# Patient Record
Sex: Female | Born: 1970 | Race: White | Hispanic: No | Marital: Married | Smoking: Never smoker
Health system: Southern US, Community
[De-identification: ages and names within clinical notes are randomized; demographics above are authoritative.]

## PROBLEM LIST (undated history)

## (undated) DIAGNOSIS — N390 Urinary tract infection, site not specified: Secondary | ICD-10-CM

## (undated) DIAGNOSIS — Z8619 Personal history of other infectious and parasitic diseases: Secondary | ICD-10-CM

## (undated) DIAGNOSIS — Z9109 Other allergy status, other than to drugs and biological substances: Secondary | ICD-10-CM

## (undated) HISTORY — DX: Other allergy status, other than to drugs and biological substances: Z91.09

## (undated) HISTORY — DX: Urinary tract infection, site not specified: N39.0

## (undated) HISTORY — PX: KNEE SURGERY: SHX244

## (undated) HISTORY — DX: Personal history of other infectious and parasitic diseases: Z86.19

---
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
## 2013-10-06 ENCOUNTER — Ambulatory Visit: Payer: Self-pay | Admitting: Gynecology

## 2013-10-20 ENCOUNTER — Other Ambulatory Visit (HOSPITAL_COMMUNITY)
Admission: RE | Admit: 2013-10-20 | Discharge: 2013-10-20 | Disposition: A | Discharge status: Home / Self Care | Payer: 59 | Source: Ambulatory Visit | Attending: Gynecology | Admitting: Gynecology

## 2013-10-20 ENCOUNTER — Ambulatory Visit (INDEPENDENT_AMBULATORY_CARE_PROVIDER_SITE_OTHER): Payer: 59 | Admitting: Gynecology

## 2013-10-20 ENCOUNTER — Encounter: Payer: Self-pay | Admitting: Gynecology

## 2013-10-20 VITALS — BP 98/68 | Ht 68.28 in | Wt 143.0 lb

## 2013-10-20 DIAGNOSIS — Z01419 Encounter for gynecological examination (general) (routine) without abnormal findings: Secondary | ICD-10-CM

## 2013-10-20 DIAGNOSIS — Z1151 Encounter for screening for human papillomavirus (HPV): Secondary | ICD-10-CM | POA: Insufficient documentation

## 2013-10-20 DIAGNOSIS — N926 Irregular menstruation, unspecified: Secondary | ICD-10-CM

## 2013-10-20 DIAGNOSIS — N841 Polyp of cervix uteri: Secondary | ICD-10-CM

## 2013-10-20 NOTE — Progress Notes (Signed)
Mirely Pangle 09/09/1970 [id number]   History:    43 y.o.  for annual gyn exam who is a new patient to the practice. Patient has moved to this country from [place]. Her last gynecological exam was approximately 3 years ago. Patient denied any prior history of abnormal Pap smears. Patient is sexually active and her partner is using condoms. Patient was having normal menstrual cycles until March whereby she had 2 menstrual cycles as follows: She bled for 3 days stopped for one day and bled for half a day. 10 days later she bled again for 5 more days. Patient stated she had a normal mammogram in 2012.  Past medical history,surgical history, family history and social history were all reviewed and documented in the EPIC chart.  Gynecologic History Patient's last menstrual period was 10/05/2013. Contraception: condoms Last Pap: 2012. Results were: normal Last mammogram: 2012. Results were: normal  Obstetric History OB History  Gravida Para Term Preterm AB SAB TAB Ectopic Multiple Living  2 2        2     # Outcome Date GA Lbr Len/2nd Weight Sex Delivery Anes PTL Lv  2 PAR           1 PAR                ROS: A ROS was performed and pertinent positives and negatives are included in the history.  GENERAL: No fevers or chills. HEENT: No change in vision, no earache, sore throat or sinus congestion. NECK: No pain or stiffness. CARDIOVASCULAR: No chest pain or pressure. No palpitations. PULMONARY: No shortness of breath, cough or wheeze. GASTROINTESTINAL: No abdominal pain, nausea, vomiting or diarrhea, melena or bright red blood per rectum. GENITOURINARY: No urinary frequency, urgency, hesitancy or dysuria. MUSCULOSKELETAL: No joint or muscle pain, no back pain, no recent trauma. DERMATOLOGIC: No rash, no itching, no lesions. ENDOCRINE: No polyuria, polydipsia, no heat or cold intolerance. No recent change in weight. HEMATOLOGICAL: No anemia or easy bruising or bleeding. NEUROLOGIC: No headache,  seizures, numbness, tingling or weakness. PSYCHIATRIC: No depression, no loss of interest in normal activity or change in sleep pattern.     Exam: chaperone present  BP 98/68  Ht 5' 8.28" (1.734 m)  Wt 143 lb (64.864 kg)  BMI 21.57 kg/m2  LMP 10/05/2013  Body mass index is 21.57 kg/(m^2).  General appearance : Well developed well nourished female. No acute distress HEENT: Neck supple, trachea midline, no carotid bruits, no thyroidmegaly Lungs: Clear to auscultation, no rhonchi or wheezes, or rib retractions  Heart: Regular rate and rhythm, no murmurs or gallops Breast:Examined in sitting and supine position were symmetrical in appearance, no palpable masses or tenderness,  no skin retraction, no nipple inversion, no nipple discharge, no skin discoloration, no axillary or supraclavicular lymphadenopathy Abdomen: no palpable masses or tenderness, no rebound or guarding Extremities: no edema or skin discoloration or tenderness  Pelvic:  Bartholin, Urethra, Skene Glands: Within normal limits             Vagina: No gross lesions or discharge  Cervix: Cervical polyp  Uterus  anteverted, normal size, shape and consistency, non-tender and mobile  Adnexa  Without masses or tenderness  Anus and perineum  normal   Rectovaginal  normal sphincter tone without palpated masses or tenderness             Hemoccult not indicated     Assessment/Plan:  43 y.o. female for annual exam with irregular cycles and a  cervical polyp was noted today. Her Pap smear was done today. She will return back to the office for sonohysterogram to better assess her intrauterine cavity and proceed with an endometrial biopsy and removal of her cervical polyp. When she returns in a fasting state the following labs will be ordered, fasting lipid profile, comprehensive metabolic panel, CBC, TSH and urinalysis. Patient was provided with a requisition schedule her mammogram. We discussed importance of calcium and vitamin D and  regular exercise for osteoporosis prevention.  Note: This dictation was prepared with  Dragon/digital dictation along withSmart phrase technology. Any transcriptional errors that result from this process are unintentional.   Terrance Mass MD, 9:39 AM 10/20/2013

## 2013-10-20 NOTE — Addendum Note (Signed)
Addended by: Su Grand A on: 10/20/2013 11:04 AM   Modules accepted: Orders

## 2013-10-20 NOTE — Patient Instructions (Addendum)
Endometrial Biopsy Endometrial biopsy is a procedure in which a tissue sample is taken from inside the uterus. The tissue sample is then looked at under a microscope to see if the tissue is normal or abnormal. The endometrium is the lining of the uterus. This procedure helps determine where you are in your menstrual cycle and how hormone levels are affecting the lining of the uterus. This procedure may also be used to evaluate uterine bleeding or to diagnose endometrial cancer, tuberculosis, polyps, or inflammatory conditions.  LET [facility] CARE PROVIDER KNOW ABOUT:  Any allergies you have.  All medicines you are taking, including vitamins, herbs, eye drops, creams, and over-the-counter medicines.  Previous problems you or members of your family have had with the use of anesthetics.  Any blood disorders you have.  Previous surgeries you have had.  Medical conditions you have.  Possibility of pregnancy. RISKS AND COMPLICATIONS Generally, this is a safe procedure. However, as with any procedure, complications can occur. Possible complications include:  Bleeding.  Pelvic infection.  Puncture of the uterine wall with the biopsy device (rare). BEFORE THE PROCEDURE   Keep a record of your menstrual cycles as directed by your health care provider. You may need to schedule your procedure for a specific time in your cycle.  You may want to bring a sanitary pad to wear home after the procedure.  Arrange for someone to drive you home after the procedure if you will be given a medicine to help you relax (sedative). PROCEDURE   You may be given a sedative to relax you.  You will lie on an exam table with your feet and legs supported as in a pelvic exam.  Your health care provider will insert an instrument (speculum) into your vagina to see your cervix.  Your cervix will be cleansed with an antiseptic solution. A medicine (local anesthetic) will be used to numb the cervix.  A forceps  instrument (tenaculum) will be used to hold your cervix steady for the biopsy.  A thin, rodlike instrument (uterine sound) will be inserted through your cervix to determine the length of your uterus and the location where the biopsy sample will be removed.  A thin, flexible tube (catheter) will be inserted through your cervix and into the uterus. The catheter is used to collect the biopsy sample from your endometrial tissue.  The catheter and speculum will then be removed, and the tissue sample will be sent to a lab for examination. AFTER THE PROCEDURE  You will rest in a recovery area until you are ready to go home.  You may have mild cramping and a small amount of vaginal bleeding for a few days after the procedure. This is normal.  Make sure you find out how to get your test results. Document Released: 10/09/2004 Document Revised: 02/08/2013 Document Reviewed: 11/23/2012 [facility] Patient Information 2014 [place], [place]. Breast Self-Awareness Practicing breast self-awareness may pick up problems early, prevent significant medical complications, and possibly save your life. By practicing breast self-awareness, you can become familiar with how your breasts look and feel and if your breasts are changing. This allows you to notice changes early. It can also offer you some reassurance that your breast health is good. One way to learn what is normal for your breasts and whether your breasts are changing is to do a breast self-exam. If you find a lump or something that was not present in the past, it is best to contact your caregiver right away. Other findings  that should be evaluated by your caregiver include nipple discharge, especially if it is bloody; skin changes or reddening; areas where the skin seems to be pulled in (retracted); or new lumps and bumps. Breast pain is seldom associated with cancer (malignancy), but should also be evaluated by a caregiver. HOW TO PERFORM A BREAST SELF-EXAM The  best time to examine your breasts is 5 7 days after your menstrual period is over. During menstruation, the breasts are lumpier, and it may be more difficult to pick up changes. If you do not menstruate, have reached menopause, or had your uterus removed (hysterectomy), you should examine your breasts at regular intervals, such as monthly. If you are breastfeeding, examine your breasts after a feeding or after using a breast pump. Breast implants do not decrease the risk for lumps or tumors, so continue to perform breast self-exams as recommended. Talk to your caregiver about how to determine the difference between the implant and breast tissue. Also, talk about the amount of pressure you should use during the exam. Over time, you will become more familiar with the variations of your breasts and more comfortable with the exam. A breast self-exam requires you to remove all your clothes above the waist. 1. Look at your breasts and nipples. Stand in front of a mirror in a room with good lighting. With your hands on your hips, push your hands firmly downward. Look for a difference in shape, contour, and size from one breast to the other (asymmetry). Asymmetry includes puckers, dips, or bumps. Also, look for skin changes, such as reddened or scaly areas on the breasts. Look for nipple changes, such as discharge, dimpling, repositioning, or redness. 2. Carefully feel your breasts. This is best done either in the shower or tub while using soapy water or when flat on your back. Place the arm (on the side of the breast you are examining) above your head. Use the pads (not the fingertips) of your three middle fingers on your opposite hand to feel your breasts. Start in the underarm area and use  inch (2 cm) overlapping circles to feel your breast. Use 3 different levels of pressure (light, medium, and firm pressure) at each circle before moving to the next circle. The light pressure is needed to feel the tissue closest to  the skin. The medium pressure will help to feel breast tissue a little deeper, while the firm pressure is needed to feel the tissue close to the ribs. Continue the overlapping circles, moving downward over the breast until you feel your ribs below your breast. Then, move one finger-width towards the center of the body. Continue to use the  inch (2 cm) overlapping circles to feel your breast as you move slowly up toward the collar bone (clavicle) near the base of the neck. Continue the up and down exam using all 3 pressures until you reach the middle of the chest. Do this with each breast, carefully feeling for lumps or changes. 3.  Keep a written record with breast changes or normal findings for each breast. By writing this information down, you do not need to depend only on memory for size, tenderness, or location. Write down where you are in your menstrual cycle, if you are still menstruating. Breast tissue can have some lumps or thick tissue. However, see your caregiver if you find anything that concerns you.  SEEK MEDICAL CARE IF:  You see a change in shape, contour, or size of your breasts or nipples.  You see skin changes, such as reddened or scaly areas on the breasts or nipples.   You have an unusual discharge from your nipples.   You feel a new lump or unusually thick areas.  Document Released: 06/08/2005 Document Revised: 05/25/2012 Document Reviewed: 09/23/2011 [facility] Patient Information 2014 [place]. Transvaginal Ultrasound Transvaginal ultrasound is a pelvic ultrasound, using a metal probe that is placed in the vagina, to look at a women's female organs. Transvaginal ultrasound is a method of seeing inside the pelvis of a woman. The ultrasound machine sends out sound waves from the transducer (probe). These sound waves bounce off body structures (like an echo) to create a picture. The picture shows up on a monitor. It is called transvaginal because the probe is inserted  into the vagina. There should be very little discomfort from the vaginal probe. This test can also be used during pregnancy. Endovaginal ultrasound is another name for a transvaginal ultrasound. In a transabdominal ultrasound, the probe is placed on the outside of the belly. This method gives pictures that are lower quality than pictures from the transvaginal technique. Transvaginal ultrasound is used to look for problems of the female genital tract. Some such problems include:  Infertility problems.  Congenital (birth defect) malformations of the uterus and ovaries.  Tumors in the uterus.  Abnormal bleeding.  Ovarian tumors and cysts.  Abscess (inflamed tissue around pus) in the pelvis.  Unexplained abdominal or pelvic pain.  Pelvic infection. DURING PREGNANCY, TRANSVAGINAL ULTRASOUND MAY BE USED TO LOOK AT:  Normal pregnancy.  Ectopic pregnancy (pregnancy outside the uterus).  Fetal heartbeat.  Abnormalities in the pelvis, that are not seen well with transabdominal ultrasound.  Suspected twins or multiples.  Impending miscarriage.  Problems with the cervix (incompetent cervix, not able to stay closed and hold the baby).  When doing an amniocentesis (removing fluid from the pregnancy sac, for testing).  Looking for abnormalities of the baby.  Checking the growth, development, and age of the fetus.  Measuring the amount of fluid in the amniotic sac.  When doing an external version of the baby (moving baby into correct position).  Evaluating the baby for problems in high risk pregnancies (biophysical profile).  Suspected fetal demise (death). Sometimes a special ultrasound method called Saline Infusion Sonography (SIS) is used for a more accurate look at the uterus. Sterile saline (salt water) is injected into the uterus of non-pregnant patients to see the inside of the uterus better. SIS is not used on pregnant women. The vaginal probe can also assist in obtaining  biopsies of abnormal areas, in draining fluid from cysts on the ovary, and in finding IUDs (intrauterine device, birth control) that cannot be located. PREPARATION FOR TEST A transvaginal ultrasound is done with the bladder empty. The transabdominal ultrasound is done with your bladder full. You may be asked to drink several glasses of water before that exam. Sometimes, a transabdominal ultrasound is done just after a transvaginal ultrasound, to look at organs in your abdomen. PROCEDURE  You will lie down on a table, with your knees bent and your feet in foot holders. The probe is covered with a condom. A sterile lubricant is put into the vagina and on the probe. The lubricant helps transmit the sound waves and avoid irritating the vagina. Your caregiver will move the probe inside the vaginal cavity to scan the pelvic structures. A normal test will show a normal pelvis and normal contents. An abnormal test will show abnormalities of the pelvis, placenta,  or baby. ABNORMAL RESULTS MAY BE DUE TO:  Growths or tumors in the:  Uterus.  Ovaries.  Vagina.  Other pelvic structures.  Non-cancerous growths of the uterus and ovaries.  Twisting of the ovary, cutting off blood supply to the ovary (ovarian torsion).  Areas of infection, including:  Pelvic inflammatory disease.  Abscess in the pelvis.  Locating an IUD. PROBLEMS FOUND IN PREGNANT WOMEN MAY INCLUDE:  Ectopic pregnancy (pregnancy outside the uterus).  Multiple pregnancies.  Early dilation (opening) of the cervix. This may indicate an incompetent cervix and early delivery.  Impending miscarriage.  Fetal death.  Problems with the placenta, including:  Placenta has grown over the opening of the womb (placenta previa).  Placenta has separated early in the womb (placental abruption).  Placenta grows into the muscle of the uterus (placenta accreta).  Tumors of pregnancy, including gestational trophoblastic disease. This is an  abnormal pregnancy, with no fetus. The uterus is filled with many grape-like cysts that could sometimes be cancerous.  Incorrect position of the fetus (breech, vertex).  Intrauterine fetal growth retardation (IUGR) (poor growth in the womb). Fetal abnormalities or infection.Tetanus, Diphtheria, Pertussis (Tdap) Vaccine What You Need to Know WHY GET VACCINATED? Tetanus, diphtheria and pertussis can be very serious diseases, even for adolescents and adults. Tdap vaccine can protect [place] from these diseases. TETANUS (Lockjaw) causes painful muscle tightening and stiffness, usually all over the body. It can lead to tightening of muscles in the head and neck so you can't open your mouth, swallow, or sometimes even breathe. Tetanus kills about 1 out of 5 people who are infected. DIPHTHERIA can cause a thick coating to form in the back of the throat. It can lead to breathing problems, paralysis, heart failure, and death. PERTUSSIS (Whooping Cough) causes severe coughing spells, which can cause difficulty breathing, vomiting and disturbed sleep. It can also lead to weight loss, incontinence, and rib fractures. Up to 2 in 100 adolescents and 5 in 100 adults with pertussis are hospitalized or have complications, which could include pneumonia and death. These diseases are caused by bacteria. Diphtheria and pertussis are spread from person to person through coughing or sneezing. Tetanus enters the body through cuts, scratches, or wounds. Before vaccines, the [place] saw as many as 200,000 cases a year of diphtheria and pertussis, and hundreds of cases of tetanus. Since vaccination began, tetanus and diphtheria have dropped by about 99% and pertussis by about 80%. TDAP VACCINE Tdap vaccine can protect adolescents and adults from tetanus, diphtheria, and pertussis. One dose of Tdap is routinely given at age 43 or 56. People who did not get Tdap at that age should get it as soon as possible. Tdap is  especially important for health care professionals and anyone having close contact with a baby younger than 12 months. Pregnant women should get a dose of Tdap during every pregnancy, to protect the newborn from pertussis. Infants are most at risk for severe, life-threatening complications from pertussis. A similar vaccine, called Td, protects from tetanus and diphtheria, but not pertussis. A Td booster should be given every 10 years. Tdap may be given as one of these boosters if you have not already gotten a dose. Tdap may also be given after a severe cut or burn to prevent tetanus infection. Your doctor can give you more information. Tdap may safely be given at the same time as other vaccines. SOME PEOPLE SHOULD NOT GET THIS VACCINE If you ever had a life-threatening allergic reaction after a  dose of any tetanus, diphtheria, or pertussis containing vaccine, OR if you have a severe allergy to any part of this vaccine, you should not get Tdap. Tell your doctor if you have any severe allergies. If you had a coma, or long or multiple seizures within 7 days after a childhood dose of DTP or DTaP, you should not get Tdap, unless a cause other than the vaccine was found. You can still get Td. Talk to your doctor if you: have epilepsy or another nervous system problem, had severe pain or swelling after any vaccine containing diphtheria, tetanus or pertussis, ever had Guillain-Barr Syndrome (GBS), aren't feeling well on the day the shot is scheduled. RISKS OF A VACCINE REACTION With any medicine, including vaccines, there is a chance of side effects. These are usually mild and go away on their own, but serious reactions are also possible. Brief fainting spells can follow a vaccination, leading to injuries from falling. Sitting or lying down for about 15 minutes can help prevent these. Tell your doctor if you feel dizzy or light-headed, or have vision changes or ringing in the ears. Mild problems following  Tdap (Did not interfere with activities) Pain where the shot was given (about 3 in 4 adolescents or 2 in 3 adults) Redness or swelling where the shot was given (about 1 person in 5) Mild fever of at least 100.47F (up to about 1 in 25 adolescents or 1 in 100 adults) Headache (about 3 or 4 people in 10) Tiredness (about 1 person in 3 or 4) Nausea, vomiting, diarrhea, stomach ache (up to 1 in 4 adolescents or 1 in 10 adults) Chills, body aches, sore joints, rash, swollen glands (uncommon) Moderate problems following Tdap (Interfered with activities, but did not require medical attention) Pain where the shot was given (about 1 in 5 adolescents or 1 in 100 adults) Redness or swelling where the shot was given (up to about 1 in 16 adolescents or 1 in 25 adults) Fever over 102F (about 1 in 100 adolescents or 1 in 250 adults) Headache (about 3 in 20 adolescents or 1 in 10 adults) Nausea, vomiting, diarrhea, stomach ache (up to 1 or 3 people in 100) Swelling of the entire arm where the shot was given (up to about 3 in 100). Severe problems following Tdap (Unable to perform usual activities, required medical attention) Swelling, severe pain, bleeding and redness in the arm where the shot was given (rare). A severe allergic reaction could occur after any vaccine (estimated less than 1 in a million doses). WHAT IF THERE IS A SERIOUS REACTION? What should I look for? Look for anything that concerns you, such as signs of a severe allergic reaction, very high fever, or behavior changes. Signs of a severe allergic reaction can include hives, swelling of the face and throat, difficulty breathing, a fast heartbeat, dizziness, and weakness. These would start a few minutes to a few hours after the vaccination. What should I do? If you think it is a severe allergic reaction or other emergency that can't wait, call 9-1-1 or get the person to the nearest hospital. Otherwise, call your doctor. Afterward, the  reaction should be reported to the "Vaccine Adverse Event Reporting System" (VAERS). Your doctor might file this report, or you can do it yourself through the VAERS web site at www.vaers.SamedayNews.es, or by calling (253)546-2112. VAERS is only for reporting reactions. They do not give medical advice.  THE NATIONAL VACCINE INJURY COMPENSATION PROGRAM The National Vaccine Injury Compensation Program (  VICP) is a federal program that was created to compensate people who may have been injured by certain vaccines. Persons who believe they may have been injured by a vaccine can learn about the program and about filing a claim by calling 432-075-0024 or visiting the [place] website at GoldCloset.com.ee. HOW CAN I LEARN MORE? Ask your doctor. Call your local or state health department. Contact the Centers for Disease Control and Prevention (CDC): Call (904) 488-5718 or visit CDC's website at http://hunter.com/. CDC Tdap Vaccine VIS (10/29/11) Document Released: 12/08/2011 Document Revised: 10/03/2012 Document Reviewed: 09/28/2012 [facility] Patient Information 2014 [place], [place]. RISKS AND COMPLICATIONS There are no known risks to the ultrasound procedure. There is no X-ray used when doing an ultrasound. Document Released: 05/20/2004 Document Revised: 08/31/2011 Document Reviewed: 05/08/2009 [facility] Patient Information 2014 [place], [place].

## 2013-10-23 ENCOUNTER — Other Ambulatory Visit: Payer: Self-pay

## 2013-10-23 DIAGNOSIS — Z1231 Encounter for screening mammogram for malignant neoplasm of breast: Secondary | ICD-10-CM

## 2013-10-30 ENCOUNTER — Other Ambulatory Visit: Payer: Self-pay | Admitting: Gynecology

## 2013-10-30 DIAGNOSIS — N841 Polyp of cervix uteri: Secondary | ICD-10-CM

## 2013-10-30 DIAGNOSIS — N926 Irregular menstruation, unspecified: Secondary | ICD-10-CM

## 2013-11-03 ENCOUNTER — Ambulatory Visit
Admission: RE | Admit: 2013-11-03 | Discharge: 2013-11-03 | Disposition: A | Discharge status: Home / Self Care | Payer: 59 | Source: Ambulatory Visit

## 2013-11-03 DIAGNOSIS — Z1231 Encounter for screening mammogram for malignant neoplasm of breast: Secondary | ICD-10-CM

## 2013-11-06 ENCOUNTER — Other Ambulatory Visit: Payer: Self-pay | Admitting: Gynecology

## 2013-11-06 DIAGNOSIS — R928 Other abnormal and inconclusive findings on diagnostic imaging of breast: Secondary | ICD-10-CM

## 2013-11-08 ENCOUNTER — Ambulatory Visit (INDEPENDENT_AMBULATORY_CARE_PROVIDER_SITE_OTHER): Payer: 59 | Admitting: Gynecology

## 2013-11-08 ENCOUNTER — Other Ambulatory Visit: Payer: Self-pay | Admitting: Gynecology

## 2013-11-08 ENCOUNTER — Ambulatory Visit (INDEPENDENT_AMBULATORY_CARE_PROVIDER_SITE_OTHER): Payer: 59

## 2013-11-08 ENCOUNTER — Other Ambulatory Visit: Payer: 59

## 2013-11-08 ENCOUNTER — Ambulatory Visit: Payer: 59 | Admitting: Gynecology

## 2013-11-08 DIAGNOSIS — N831 Corpus luteum cyst of ovary, unspecified side: Secondary | ICD-10-CM

## 2013-11-08 DIAGNOSIS — D3910 Neoplasm of uncertain behavior of unspecified ovary: Secondary | ICD-10-CM

## 2013-11-08 DIAGNOSIS — N83209 Unspecified ovarian cyst, unspecified side: Secondary | ICD-10-CM

## 2013-11-08 DIAGNOSIS — Z01419 Encounter for gynecological examination (general) (routine) without abnormal findings: Secondary | ICD-10-CM

## 2013-11-08 DIAGNOSIS — N854 Malposition of uterus: Secondary | ICD-10-CM

## 2013-11-08 DIAGNOSIS — O34539 Maternal care for retroversion of gravid uterus, unspecified trimester: Secondary | ICD-10-CM

## 2013-11-08 DIAGNOSIS — N9489 Other specified conditions associated with female genital organs and menstrual cycle: Secondary | ICD-10-CM

## 2013-11-08 DIAGNOSIS — N839 Noninflammatory disorder of ovary, fallopian tube and broad ligament, unspecified: Secondary | ICD-10-CM

## 2013-11-08 DIAGNOSIS — N926 Irregular menstruation, unspecified: Secondary | ICD-10-CM

## 2013-11-08 DIAGNOSIS — O34519 Maternal care for incarceration of gravid uterus, unspecified trimester: Secondary | ICD-10-CM

## 2013-11-08 DIAGNOSIS — N838 Other noninflammatory disorders of ovary, fallopian tube and broad ligament: Secondary | ICD-10-CM

## 2013-11-08 DIAGNOSIS — N841 Polyp of cervix uteri: Secondary | ICD-10-CM

## 2013-11-08 LAB — CBC WITH DIFFERENTIAL/PLATELET
Basophils Absolute: 0 10*3/uL (ref 0.0–0.1)
Basophils Relative: 0 % (ref 0–1)
Eosinophils Absolute: 0.3 10*3/uL (ref 0.0–0.7)
Eosinophils Relative: 4 % (ref 0–5)
HEMATOCRIT: 39.9 % (ref 36.0–46.0)
Hemoglobin: 13.4 g/dL (ref 12.0–15.0)
Lymphocytes Relative: 22 % (ref 12–46)
Lymphs Abs: 1.5 10*3/uL (ref 0.7–4.0)
MCH: 30.9 pg (ref 26.0–34.0)
MCHC: 33.6 g/dL (ref 30.0–36.0)
MCV: 92.1 fL (ref 78.0–100.0)
MONO ABS: 0.7 10*3/uL (ref 0.1–1.0)
MONOS PCT: 10 % (ref 3–12)
Neutro Abs: 4.4 10*3/uL (ref 1.7–7.7)
Neutrophils Relative %: 64 % (ref 43–77)
Platelets: 266 10*3/uL (ref 150–400)
RBC: 4.33 MIL/uL (ref 3.87–5.11)
RDW: 12.4 % (ref 11.5–15.5)
WBC: 6.8 10*3/uL (ref 4.0–10.5)

## 2013-11-08 LAB — COMPREHENSIVE METABOLIC PANEL
ALBUMIN: 4.4 g/dL (ref 3.5–5.2)
ALT: <8 U/L (ref 0–35)
AST: 16 U/L (ref 0–37)
Alkaline Phosphatase: 27 U/L — ABNORMAL LOW (ref 39–117)
BUN: 15 mg/dL (ref 6–23)
CALCIUM: 9.6 mg/dL (ref 8.4–10.5)
CHLORIDE: 105 meq/L (ref 96–112)
CO2: 26 mEq/L (ref 19–32)
Creat: 1.07 mg/dL (ref 0.50–1.10)
Glucose, Bld: 83 mg/dL (ref 70–99)
Potassium: 4.2 mEq/L (ref 3.5–5.3)
Sodium: 139 mEq/L (ref 135–145)
Total Bilirubin: 0.8 mg/dL (ref 0.2–1.2)
Total Protein: 7.3 g/dL (ref 6.0–8.3)

## 2013-11-08 LAB — LIPID PANEL
Cholesterol: 150 mg/dL (ref 0–200)
HDL: 66 mg/dL (ref >39)
LDL Cholesterol: 72 mg/dL (ref 0–99)
Total CHOL/HDL Ratio: 2.3 Ratio
Triglycerides: 61 mg/dL (ref <150)
VLDL: 12 mg/dL (ref 0–40)

## 2013-11-08 LAB — TSH: TSH: 4.189 u[IU]/mL (ref 0.350–4.500)

## 2013-11-08 NOTE — Patient Instructions (Signed)

## 2013-11-08 NOTE — Progress Notes (Signed)
   Patient was seen in the office as a new patient only the first who had moved [place] this [place] for suite. She had not had a gynecological exam for over 3 years. Patient denied any history of abnormal Pap smears in the past and was using condoms for contraception. She was complaining of irregular menstrual cycles as described in detail on that last office visit May 1 of this year. She is here today for a sonohysterogram and endometrial biopsy. At time of her exam she was also found to have a cervical polyp.  Patient was counseled for the above procedures.  Ultrasound: Uterus measures 7.8 x 5.0 x 4.9 cm with endometrial stripe of 12 mm. The patient was found to have on her right ovary 2 solid calcified focal masses posterior wall shadowing effect was also noted measuring 19 x 21 mm and a second one 21 x 20 mm which was avascular. A millimeter cyst internal level echoes was noted also. Left ovarian follicles with echo-free measuring 29 x 21 mm, and 27 x 22 mm. There was free fluid noted in the left ovary.  Sonohysterogram: A sterile catheter was introduced into the uterine cavity and normal saline was instilled. There was no intrauterine defects noted.  Following this a sterile Pipelle was introduced into the uterine cavity for an endometrial biopsy moderate amount of tissue was obtained and was submitted for histological evaluation.  Attention was then placed to the cervical polyp which was grasped with a ring forceps twisted off its base and submitted for histological evaluation. Silver nitrate was applied for hemostasis at the base. And the specimen submitted also for histological evaluation.  Patient's recent Pap smear was normal.  Assessment/plan: #1 cervical polyp removed pathology report pending #2 irregular bleeding postcoital bleeding probably attributed to #1 above nevertheless endometrial biopsy was performed results pending at time of this dictation #3 incidental finding 2 solid calcified  focal cystic masses on right ovary 1 measuring 19 x 21 mm second one measuring 21 x 20 mm and avascular. Also to follicles 29 x 21 mm and 27 x 22 mm. Because of patient's age and appearance of this right adnexal mass were going to have a ROMA-1 ovarian cancer screening blood test. If normal she is going to return back in 3 months for followup ultrasound. She was also fasting today several fasting lipid profile, CBC, TSH, comprehensive metabolic panel and urinalysis was obtained as well.

## 2013-11-09 LAB — URINALYSIS W MICROSCOPIC + REFLEX CULTURE
Bilirubin Urine: NEGATIVE
CASTS: NONE SEEN
CRYSTALS: NONE SEEN
Glucose, UA: NEGATIVE mg/dL
Hgb urine dipstick: NEGATIVE
Ketones, ur: NEGATIVE mg/dL
LEUKOCYTES UA: NEGATIVE
NITRITE: NEGATIVE
PH: 5.5 (ref 5.0–8.0)
Protein, ur: NEGATIVE mg/dL
SPECIFIC GRAVITY, URINE: 1.01 (ref 1.005–1.030)
Urobilinogen, UA: 0.2 mg/dL (ref 0.0–1.0)

## 2013-11-12 LAB — URINE CULTURE

## 2013-11-15 ENCOUNTER — Other Ambulatory Visit: Payer: Self-pay | Admitting: Gynecology

## 2013-11-15 MED ORDER — CIPROFLOXACIN HCL 250 MG PO TABS: 250.0000 mg | ORAL_TABLET | Freq: Two times a day (BID) | ORAL | Status: DC

## 2013-11-17 ENCOUNTER — Ambulatory Visit
Admission: RE | Admit: 2013-11-17 | Discharge: 2013-11-17 | Disposition: A | Discharge status: Home / Self Care | Payer: 59 | Source: Ambulatory Visit | Attending: Gynecology | Admitting: Gynecology

## 2013-11-17 DIAGNOSIS — R928 Other abnormal and inconclusive findings on diagnostic imaging of breast: Secondary | ICD-10-CM

## 2013-11-17 LAB — OVARIAN MALIGNANCY RISK-ROMA
CA125: 6 U/mL
HE4: 70 pmol
ROMA Postmenopausal: 0.86
ROMA Premenopausal: 1.45 — ABNORMAL HIGH

## 2013-11-20 ENCOUNTER — Telehealth: Payer: Self-pay | Admitting: Gynecology

## 2013-11-20 NOTE — Telephone Encounter (Signed)
Spoke with patient today in reference to her ROMA-1 ovarian cancer screening test which was elevated for patient's in the premenopausal age. Patient was seen in the office as a new patient on 10/20/2013 see note for detail. Patient had been complaining of dysfunctional uterine bleeding and a polyp was removed from the cervix which was benign and her ultrasound had demonstrated the right ovary with solid calcified septum masses with posterior shadowing measuring 19 x 21 mm, 21 x 20 mm, avascular. An 8 mm cyst with internal low level echoes was also noted. Left ovarian follicles pedicle free 29 x 21 mm and 27 x 22 mm. The sonohysterogram demonstrated no defects.  Her blood test result was as follows: Ref Range 1wk ago ROMA Premenopausal <1.31  1.45 (H)   ROMA Postmenopausal <2.77  0.86   CA125 <35 U/mL  6   HE4 <151 pM  70    I have recommended that she be followed by the GYN oncologist for a second opinion based on the findings of the right ovarian cystic calcifications along with the elevated ROMA-1 ovarian cancer screening tests. We will set up an appointment for her. All questions answered. Her endometrial biopsy and cervical polyp or benign as was her Pap smear.

## 2013-11-21 ENCOUNTER — Telehealth: Payer: Self-pay | Admitting: *Deleted

## 2013-11-21 NOTE — Telephone Encounter (Signed)
Message copied by Thamas Jaegers on Tue Nov 21, 2013  3:12 PM ------      Message from: Ramond Craver      Created: Mon Nov 20, 2013  2:16 PM      Regarding: Patient you have referral request for.                   ----- Message -----         From: Terrance Mass, MD         Sent: 11/20/2013   2:04 PM           To: Parachute with patient today in reference to her ROMA-1 ovarian cancer screening test which was elevated for patient's in the premenopausal age. Patient was seen in the office as a new patient on 10/20/2013 see note for detail. Patient had been complaining of dysfunctional uterine bleeding and a polyp was removed from the cervix which was benign and her ultrasound had demonstrated the right ovary with solid calcified septum masses with posterior shadowing measuring 19 x 21 mm, 21 x 20 mm, avascular. An 8 mm cyst with internal low level echoes was also noted. Left ovarian follicles pedicle free 29 x 21 mm and 27 x 22 mm. The sonohysterogram demonstrated no defects.                  Her blood test result was as follows:      Ref Range 1wk ago      ROMA Premenopausal <1.31       1.45 (H)                ROMA Postmenopausal <2.77        0.86                CA125 <35 U/mL        6                HE4 <151 pM        70                            I have recommended that she be followed by the GYN oncologist for a second opinion based on the findings of the right ovarian cystic calcifications along with the elevated ROMA-1 ovarian cancer screening tests. We will set up an appointment for her. All questions answered. Her endometrial biopsy and cervical polyp or benign as was her Pap smear.            Juliann Pulse has has been scheduled yet?       ------

## 2013-11-21 NOTE — Telephone Encounter (Signed)
Appointment on 12/18/13 @ 9:15 am with [facility] pt informed with the below note.

## 2013-12-06 ENCOUNTER — Encounter: Payer: Self-pay | Admitting: Gynecology

## 2013-12-06 ENCOUNTER — Ambulatory Visit: Payer: 59 | Attending: Gynecology | Admitting: Gynecology

## 2013-12-06 VITALS — BP 121/74 | HR 77 | Temp 98.8°F | Resp 18 | Ht 68.5 in

## 2013-12-06 DIAGNOSIS — N83209 Unspecified ovarian cyst, unspecified side: Secondary | ICD-10-CM

## 2013-12-06 DIAGNOSIS — N841 Polyp of cervix uteri: Secondary | ICD-10-CM | POA: Insufficient documentation

## 2013-12-06 DIAGNOSIS — N921 Excessive and frequent menstruation with irregular cycle: Secondary | ICD-10-CM

## 2013-12-06 NOTE — Patient Instructions (Signed)
Return to Dr. Toney Rakes for a repeat ultrasound in August as scheduled.

## 2013-12-06 NOTE — Progress Notes (Signed)
Consult Note: Gyn-Onc   Shari George 43 y.o. female  Chief Complaint  Patient presents with  . Metrorrhagia    New consult     Assessment : Small right ovarian cyst with calcification found incidentally on ultrasound. R OMA score is slightly elevated. Options were discussed with the patient and her husband. Basically we continued to observe this lesion which I believe is likely benign. Or alternatively, recommend laparoscopic right salpingo-oophorectomy. After discussing the pros and cons of each approach, the patient and her husband are comfortable with continuing observation. Therefore recommend she have repeat ultrasound approximately 3 months. She tells me she has one already scheduled with Dr. Rudi Rummage. As long as there is no significant change, I recommend a followup scan 6 months later. If on the other hand there is continuing enlargement or changing characteristics of the cysts and light see the patient back for further evaluation.  Plan: The patient will return to Dr. Ulice Brilliant office to have a repeat ultrasound in August.  HPI:  43 year old white married female seen in consultation request of Dr. Uvaldo Rising regarding a newly diagnosed ovarian mass. Patient was seen initially by Dr. Toney Rakes for annual examination and some irregular menstrual cycles. She was found to have a cervical polyp. An ultrasound was performed with findings of an incidental 1.9 x 2.1 cm and 2.1 x 2.0 cm calcified focally cystic masses in the right ovary. A ROMA test was performed that was slightly abnormal in the setting of a premenopausal patient (1.45 when normal is less than 1.31) CA 125 was 6 units per mL and a HE4 was 70 (normal less than 150).  The patient reports she is entirely asymptomatic. She says it denies any pelvic pain pressure or any GI or GU symptoms. She has no symptoms suggestive of ovarian cancer. She does not have any family history of breast or ovarian cancer.  Review of  Systems:10 point review of systems is negative except as noted in interval history.   Vitals: Blood pressure 121/74, pulse 77, temperature 98.8 F (37.1 C), temperature source Oral, resp. rate 18, height 5' 8.5" (1.74 m).  Physical Exam: General : The patient is a healthy woman in no acute distress.  HEENT: normocephalic, extraoccular movements normal; neck is supple without thyromegally  Lynphnodes: Supraclavicular and inguinal nodes not enlarged  Abdomen: Soft, non-tender, no ascites, no organomegally, no masses, no hernias  Pelvic:  EGBUS: Normal female  Vagina: Normal, no lesions  Urethra and Bladder: Normal, non-tender  Cervix: Parous Uterus: Anterior normal shape size and consistency  Bi-manual examination: Non-tender; no adenxal masses or nodularity  Rectal: normal sphincter tone, no masses, no blood  Lower extremities: No edema or varicosities. Normal range of motion      No Known Allergies  History reviewed. No pertinent past medical history.  Past Surgical History  Procedure Laterality Date  . Knee surgery Left   . Left  knee   1988    " Bone lying on muscle"     Current Outpatient Prescriptions  Medication Sig Dispense Refill  . ciprofloxacin (CIPRO) 250 MG tablet Take 1 tablet (250 mg total) by mouth 2 (two) times daily.  6 tablet  0   No current facility-administered medications for this visit.    History   Social History  . Marital Status: Married    Spouse Name: N/A    Number of Children: N/A  . Years of Education: N/A   Occupational History  . Not on file.   Social  History Main Topics  . Smoking status: Never Smoker   . Smokeless tobacco: Not on file  . Alcohol Use: 1.2 oz/week    2 Glasses of wine per week     Comment: MODERATE  . Drug Use: Not on file  . Sexual Activity: Yes    Birth Control/ Protection: None   Other Topics Concern  . Not on file   Social History Narrative  . No narrative on file    Family History  Problem  Relation Age of Onset  . Hypertension Father   . Heart Problems Father       Alvino Chapel, MD 12/06/2013, 11:33 AM

## 2013-12-18 ENCOUNTER — Ambulatory Visit: Payer: 59 | Admitting: Gynecology

## 2014-02-12 ENCOUNTER — Ambulatory Visit (INDEPENDENT_AMBULATORY_CARE_PROVIDER_SITE_OTHER): Payer: 59 | Admitting: Gynecology

## 2014-02-12 ENCOUNTER — Ambulatory Visit (INDEPENDENT_AMBULATORY_CARE_PROVIDER_SITE_OTHER): Payer: 59

## 2014-02-12 ENCOUNTER — Encounter: Payer: Self-pay | Admitting: Gynecology

## 2014-02-12 ENCOUNTER — Ambulatory Visit: Payer: 59 | Admitting: Gynecology

## 2014-02-12 ENCOUNTER — Other Ambulatory Visit: Payer: 59

## 2014-02-12 ENCOUNTER — Other Ambulatory Visit: Payer: Self-pay | Admitting: Gynecology

## 2014-02-12 VITALS — BP 100/68

## 2014-02-12 DIAGNOSIS — N831 Corpus luteum cyst of ovary, unspecified side: Secondary | ICD-10-CM

## 2014-02-12 DIAGNOSIS — N9489 Other specified conditions associated with female genital organs and menstrual cycle: Secondary | ICD-10-CM

## 2014-02-12 DIAGNOSIS — N926 Irregular menstruation, unspecified: Secondary | ICD-10-CM

## 2014-02-12 DIAGNOSIS — N83209 Unspecified ovarian cyst, unspecified side: Secondary | ICD-10-CM

## 2014-02-12 DIAGNOSIS — N83201 Unspecified ovarian cyst, right side: Secondary | ICD-10-CM

## 2014-02-12 NOTE — Patient Instructions (Addendum)
Ovarian Cyst An ovarian cyst is a fluid-filled sac that forms on an ovary. The ovaries are small organs that produce eggs in women. Various types of cysts can form on the ovaries. Most are not cancerous. Many do not cause problems, and they often go away on their own. Some may cause symptoms and require treatment. Common types of ovarian cysts include:  Functional cysts--These cysts may occur every month during the menstrual cycle. This is normal. The cysts usually go away with the next menstrual cycle if the woman does not get pregnant. Usually, there are no symptoms with a functional cyst.  Endometrioma cysts--These cysts form from the tissue that lines the uterus. They are also called "chocolate cysts" because they become filled with blood that turns brown. This type of cyst can cause pain in the lower abdomen during intercourse and with your menstrual period.  Cystadenoma cysts--This type develops from the cells on the outside of the ovary. These cysts can get very big and cause lower abdomen pain and pain with intercourse. This type of cyst can twist on itself, cut off its blood supply, and cause severe pain. It can also easily rupture and cause a lot of pain.  Dermoid cysts--This type of cyst is sometimes found in both ovaries. These cysts may contain different kinds of body tissue, such as skin, teeth, hair, or cartilage. They usually do not cause symptoms unless they get very big.  Theca lutein cysts--These cysts occur when too much of a certain hormone (human chorionic gonadotropin) is produced and overstimulates the ovaries to produce an egg. This is most common after procedures used to assist with the conception of a baby (in vitro fertilization). CAUSES   Fertility drugs can cause a condition in which multiple large cysts are formed on the ovaries. This is called ovarian hyperstimulation syndrome.  A condition called polycystic ovary syndrome can cause hormonal imbalances that can lead to  nonfunctional ovarian cysts. SIGNS AND SYMPTOMS  Many ovarian cysts do not cause symptoms. If symptoms are present, they may include:  Pelvic pain or pressure.  Pain in the lower abdomen.  Pain during sexual intercourse.  Increasing girth (swelling) of the abdomen.  Abnormal menstrual periods.  Increasing pain with menstrual periods.  Stopping having menstrual periods without being pregnant. DIAGNOSIS  These cysts are commonly found during a routine or annual pelvic exam. Tests may be ordered to find out more about the cyst. These tests may include:  Ultrasound.  X-ray of the pelvis.  CT scan.  MRI.  Blood tests. TREATMENT  Many ovarian cysts go away on their own without treatment. Your health care provider may want to check your cyst regularly for 2-3 months to see if it changes. For women in menopause, it is particularly important to monitor a cyst closely because of the higher rate of ovarian cancer in menopausal women. When treatment is needed, it may include any of the following:  A procedure to drain the cyst (aspiration). This may be done using a long needle and ultrasound. It can also be done through a laparoscopic procedure. This involves using a thin, lighted tube with a tiny camera on the end (laparoscope) inserted through a small incision.  Surgery to remove the whole cyst. This may be done using laparoscopic surgery or an open surgery involving a larger incision in the lower abdomen.  Hormone treatment or birth control pills. These methods are sometimes used to help dissolve a cyst. HOME CARE INSTRUCTIONS   Only take over-the-counter   or prescription medicines as directed by your health care provider.  Follow up with your health care provider as directed.  Get regular pelvic exams and Pap tests. SEEK MEDICAL CARE IF:   Your periods are late, irregular, or painful, or they stop.  Your pelvic pain or abdominal pain does not go away.  Your abdomen becomes  larger or swollen.  You have pressure on your bladder or trouble emptying your bladder completely.  You have pain during sexual intercourse.  You have feelings of fullness, pressure, or discomfort in your stomach.  You lose weight for no apparent reason.  You feel generally ill.  You become constipated.  You lose your appetite.  You develop acne.  You have an increase in body and facial hair.  You are gaining weight, without changing your exercise and eating habits.  You think you are pregnant. SEEK IMMEDIATE MEDICAL CARE IF:   You have increasing abdominal pain.  You feel sick to your stomach (nauseous), and you throw up (vomit).  You develop a fever that comes on suddenly.  You have abdominal pain during a bowel movement.  Your menstrual periods become heavier than usual. MAKE SURE YOU:  Understand these instructions.  Will watch your condition.  Will get help right away if you are not doing well or get worse. Document Released: 06/08/2005 Document Revised: 06/13/2013 Document Reviewed: 02/13/2013 [employer] Patient Information 2015 [employer]. This information is not intended to replace advice given to you by your health care provider. Make sure you discuss any questions you have with your health care provider.  

## 2014-02-12 NOTE — Progress Notes (Signed)
   Patient is a 43 year old who presented to the office for followup ultrasound. Patient was seen in the office as a new patient on 11/08/2013 patient reported no prior gynecological exam in over 3 years since [place] She had been complaining of irregular menstrual cycles and had been using condoms for contraception. An ultrasound was done at that office visit with the following findings:  Uterus measures 7.8 x 5.0 x 4.9 cm with endometrial stripe of 12 mm. The patient was found to have on her right ovary 2 solid calcified focal masses posterior wall shadowing effect was also noted measuring 19 x 21 mm and a second one 21 x 20 mm which was avascular. A millimeter cyst internal level echoes was noted also. Left ovarian follicles with echo-free measuring 29 x 21 mm, and 27 x 22 mm. There was free fluid noted in the left ovary.  That office visit patient had a negative sonohysterogram in her endometrial biopsy was benign. She did have a cervical polyp that was removed that may have been contributing to her irregular cycle which was benign also. As a result of the above-mentioned cyst noted a ROMA-1 ovarian cancer screening was ordered in the phone results were noted:  Ref Range 21mo ago ROMA Premenopausal <1.31  1.45 (H)   ROMA Postmenopausal <2.77  0.86   CA125 <35 U/mL  6   HE4 <151 pM  70    She was referred to the GYN oncologist Dr. Charlaine Dalton for consultation. His assessment was as follows: "Small right ovarian cyst with calcification found incidentally on ultrasound. R OMA score is slightly elevated. Options were discussed with the patient and her husband. Basically we continued to observe this lesion which I believe is likely benign. Or alternatively, recommend laparoscopic right salpingo-oophorectomy. After discussing the pros and cons of each approach, the patient and her husband are comfortable with continuing observation. Therefore recommend  she have repeat ultrasound approximately 3 months. She tells me she has one already scheduled with Dr. Rudi Rummage. As long as there is no significant change, I recommend a followup scan 6 months later. If on the other hand there is continuing enlargement or changing characteristics of the cysts and light see the patient back for further evaluation."  Patient's here for ultrasound followup and is asymptomatic. Results of ultrasound today: Uterus measured 8.6 x 5.7 x 4.0 cm with endometrial stripe of 2.1 mm. 2 right ovarian solid masses calcified wall posterior shadowing was noted. One measured 17 x 18 x 15 mm average size 17 mm the second one measured 21 x 18 x 22 mm R. size 20 mm. No change in size from previous scan. 2 small thick wall cyst were noted measuring 11 x 11 and 16 x 8 mm negative color flow. Left ovary was normal. No fluid in the cul-de-sac.  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 Patient asymptomatic. We'll follow the GYN oncologist recommendation as well as per her request to follow up in an ultrasound in 6 months. We'll check her ROMA-1 ovarian cancer screening blood tests if the cyst does not resolve. She is fully aware to contact the office if she feels bloated or has any irregular bleeding over like to proceed with laparoscopic removal of cyst and/or ovary. Literature and information was provided all questions answered.

## 2014-04-23 ENCOUNTER — Encounter: Payer: Self-pay | Admitting: Gynecology

## 2014-06-12 ENCOUNTER — Ambulatory Visit (INDEPENDENT_AMBULATORY_CARE_PROVIDER_SITE_OTHER): Payer: 59 | Admitting: Physician Assistant

## 2014-06-12 ENCOUNTER — Encounter: Payer: Self-pay | Admitting: Physician Assistant

## 2014-06-12 VITALS — BP 99/57 | HR 59 | Temp 98.4°F | Resp 16 | Ht 68.25 in | Wt 149.1 lb

## 2014-06-12 DIAGNOSIS — R399 Unspecified symptoms and signs involving the genitourinary system: Secondary | ICD-10-CM

## 2014-06-12 LAB — POCT URINALYSIS DIPSTICK
Bilirubin, UA: NEGATIVE
Blood, UA: NEGATIVE
Glucose, UA: NEGATIVE
KETONES UA: NEGATIVE
Nitrite, UA: NEGATIVE
PH UA: 6
Protein, UA: NEGATIVE
Spec Grav, UA: 1.015
Urobilinogen, UA: 0.2

## 2014-06-12 MED ORDER — CIPROFLOXACIN HCL 250 MG PO TABS: 250.0000 mg | ORAL_TABLET | Freq: Two times a day (BID) | ORAL | Status: DC

## 2014-06-12 NOTE — Assessment & Plan Note (Signed)
Rx Ciprofloxacin 250 mg BID x 3 days.  Increase fluids.  Continue AZO and cranberry supplement.  Urine sent for culture.  Return precautions discussed with patient.

## 2014-06-12 NOTE — Progress Notes (Signed)
Pre visit review using our clinic review tool, if applicable. No additional management support is needed unless otherwise documented below in the visit note/SLS  

## 2014-06-12 NOTE — Patient Instructions (Signed)
Your symptoms are consistent with a bladder infection, also called acute cystitis. Please take your antibiotic (Ciprofloxacin) as directed until all pills are gone.  Stay very well hydrated.  Consider a daily probiotic (Align, Culturelle, or Activia) to help prevent stomach upset caused by the antibiotic.  Taking a probiotic daily may also help prevent recurrent UTIs.  Also consider taking AZO (Phenazopyridine) tablets to help decrease pain with urination.  I will call you with your urine testing results.  We will change antibiotics if indicated.  Call or return to clinic if symptoms are not resolved by completion of antibiotic.   Urinary Tract Infection A urinary tract infection (UTI) can occur any place along the urinary tract. The tract includes the kidneys, ureters, bladder, and urethra. A type of germ called bacteria often causes a UTI. UTIs are often helped with antibiotic medicine.  HOME CARE   If given, take antibiotics as told by your doctor. Finish them even if you start to feel better.  Drink enough fluids to keep your pee (urine) clear or pale yellow.  Avoid tea, drinks with caffeine, and bubbly (carbonated) drinks.  Pee often. Avoid holding your pee in for a long time.  Pee before and after having sex (intercourse).  Wipe from front to back after you poop (bowel movement) if you are a woman. Use each tissue only once. GET HELP RIGHT AWAY IF:   You have back pain.  You have lower belly (abdominal) pain.  You have chills.  You feel sick to your stomach (nauseous).  You throw up (vomit).  Your burning or discomfort with peeing does not go away.  You have a fever.  Your symptoms are not better in 3 days. MAKE SURE YOU:   Understand these instructions.  Will watch your condition.  Will get help right away if you are not doing well or get worse. Document Released: 11/25/2007 Document Revised: 03/02/2012 Document Reviewed: 01/07/2012 [facility] ([facility] Patient Information 2015  [place], [place]. This information is not intended to replace advice given to you by your health care provider. Make sure you discuss any questions you have with your health care provider.

## 2014-06-12 NOTE — Progress Notes (Signed)
   History of Present Illness: Shari George is a 43 y.o. female who present to the clinic today as a new patient complaining of urinary frequency, urinary urgency and dysuria x 5 days.  Patient endorses + history of uncomplicated UTI.  Patient denies fever, chills, nausea/vomiting and flank pain. Has been taking AZO with mild relief of symptoms.  Has also been taking a cranberry supplement.   History: Past Medical History  Diagnosis Date  . History of chicken pox   . Environmental allergies   . UTI (lower urinary tract infection)    Current outpatient prescriptions: Calcium-Vitamin D 600-200 MG-UNIT per tablet, Take 1 tablet by mouth daily., Disp: , Rfl: ;  ciprofloxacin (CIPRO) 250 MG tablet, Take 1 tablet (250 mg total) by mouth 2 (two) times daily., Disp: 6 tablet, Rfl: 0 No Known Allergies Family History  Problem Relation Age of Onset  . Hypertension Father     Living  . Heart Problems Father   . Healthy Mother     Living  . Heart disease Paternal Grandfather   . Arthritis Paternal Aunt   . Healthy Brother     x1  . Healthy Son     x1  . Healthy Daughter     x1   History   Social History  . Marital Status: Married    Spouse Name: N/A    Number of Children: N/A  . Years of Education: N/A   Social History Main Topics  . Smoking status: Never Smoker   . Smokeless tobacco: None  . Alcohol Use: 1.2 oz/week    2 Glasses of wine per week     Comment: MODERATE  . Drug Use: None  . Sexual Activity: Yes    Birth Control/ Protection: None   Other Topics Concern  . None   Social History Narrative   Review of Systems: See HPI.  All other ROS are negative.  Physical Examination: BP 99/57 mmHg  Pulse 59  Temp(Src) 98.4 F (36.9 C) (Oral)  Resp 16  Ht 5' 8.25" (1.734 m)  Wt 149 lb 2 oz (67.643 kg)  BMI 22.50 kg/m2  SpO2 100%  LMP 05/27/2014  General appearance: alert, cooperative, appears stated age and no distress Head: Normocephalic, without obvious  abnormality, atraumatic Lungs: clear to auscultation bilaterally Heart: regular rate and rhythm, S1, S2 normal, no murmur, click, rub or gallop Abdomen: soft, non-tender; bowel sounds normal; no masses,  no organomegaly and no CVA tenderness.  Labs/Diagnostics: Urinalysis    Component Value Date/Time   COLORURINE YELLOW 11/08/2013 0840   APPEARANCEUR CLEAR 11/08/2013 0840   LABSPEC 1.010 11/08/2013 0840   PHURINE 5.5 11/08/2013 0840   GLUCOSEU NEG 11/08/2013 0840   HGBUR NEG 11/08/2013 0840   BILIRUBINUR neg 06/12/2014 1453   BILIRUBINUR NEG 11/08/2013 0840   KETONESUR NEG 11/08/2013 0840   PROTEINUR neg 06/12/2014 1453   PROTEINUR NEG 11/08/2013 0840   UROBILINOGEN 0.2 06/12/2014 1453   UROBILINOGEN 0.2 11/08/2013 0840   NITRITE neg 06/12/2014 1453   NITRITE NEG 11/08/2013 0840   LEUKOCYTESUR small (1+) 06/12/2014 1453    Assessment/Plan: UTI symptoms Rx Ciprofloxacin 250 mg BID x 3 days.  Increase fluids.  Continue AZO and cranberry supplement.  Urine sent for culture.  Return precautions discussed with patient.

## 2014-06-14 LAB — CULTURE, URINE COMPREHENSIVE
Colony Count: NO GROWTH
ORGANISM ID, BACTERIA: NO GROWTH

## 2014-07-03 ENCOUNTER — Encounter: Payer: Self-pay | Admitting: Physician Assistant

## 2014-07-03 ENCOUNTER — Ambulatory Visit (INDEPENDENT_AMBULATORY_CARE_PROVIDER_SITE_OTHER): Payer: 59 | Admitting: Physician Assistant

## 2014-07-03 ENCOUNTER — Ambulatory Visit (HOSPITAL_BASED_OUTPATIENT_CLINIC_OR_DEPARTMENT_OTHER)
Admission: RE | Admit: 2014-07-03 | Discharge: 2014-07-03 | Disposition: A | Discharge status: Home / Self Care | Payer: 59 | Source: Ambulatory Visit | Attending: Physician Assistant | Admitting: Physician Assistant

## 2014-07-03 VITALS — BP 97/64 | HR 62 | Temp 98.2°F | Resp 16 | Ht 68.25 in | Wt 148.5 lb

## 2014-07-03 DIAGNOSIS — N390 Urinary tract infection, site not specified: Secondary | ICD-10-CM | POA: Diagnosis present

## 2014-07-03 DIAGNOSIS — R8290 Unspecified abnormal findings in urine: Secondary | ICD-10-CM | POA: Insufficient documentation

## 2014-07-03 DIAGNOSIS — R399 Unspecified symptoms and signs involving the genitourinary system: Secondary | ICD-10-CM

## 2014-07-03 LAB — URINALYSIS, MICROSCOPIC ONLY: RBC / HPF: NONE SEEN (ref 0–?)

## 2014-07-03 LAB — POCT URINALYSIS DIPSTICK
Bilirubin, UA: NEGATIVE
Glucose, UA: NEGATIVE
Ketones, UA: NEGATIVE
Leukocytes, UA: NEGATIVE
NITRITE UA: NEGATIVE
PH UA: 5.5
PROTEIN UA: NEGATIVE
RBC UA: NEGATIVE
Spec Grav, UA: 1.025
Urobilinogen, UA: 0.2

## 2014-07-03 LAB — BASIC METABOLIC PANEL
BUN: 20 mg/dL (ref 6–23)
CO2: 23 meq/L (ref 19–32)
CREATININE: 1.1 mg/dL (ref 0.4–1.2)
Calcium: 9.3 mg/dL (ref 8.4–10.5)
Chloride: 106 mEq/L (ref 96–112)
GFR: 56.21 mL/min — ABNORMAL LOW (ref >60.00)
GLUCOSE: 87 mg/dL (ref 70–99)
Potassium: 4.2 mEq/L (ref 3.5–5.1)
Sodium: 136 mEq/L (ref 135–145)

## 2014-07-03 NOTE — Progress Notes (Signed)
Pre visit review using our clinic review tool, if applicable. No additional management support is needed unless otherwise documented below in the visit note/SLS  

## 2014-07-04 ENCOUNTER — Telehealth: Payer: Self-pay | Admitting: Physician Assistant

## 2014-07-04 DIAGNOSIS — D259 Leiomyoma of uterus, unspecified: Secondary | ICD-10-CM

## 2014-07-04 LAB — CULTURE, URINE COMPREHENSIVE
COLONY COUNT: NO GROWTH
Organism ID, Bacteria: NO GROWTH

## 2014-07-04 NOTE — Telephone Encounter (Signed)
Order placed

## 2014-07-04 NOTE — Telephone Encounter (Signed)
-----   Message from Rockwell [place], [place] sent at 07/04/2014  4:33 PM EST ----- Regarding: Order Patient informed, understood & agreed; please proceed forward with Order for Pelvic US/SLS  ----- Message -----    From: Brunetta Jeans, PA-C    Sent: 07/04/2014   7:22 AM      To: Rockwell [place], CMA  Labs look good.  Her X-ray is negative for a stone.  There is a calcification in her pelvis that is not well defined on x-ray.  Radiology recommends a pelvic ultrasound to further assess.  Is she willing to let us set that up?  Still waiting on urine culture results.

## 2014-07-05 DIAGNOSIS — R8290 Unspecified abnormal findings in urine: Secondary | ICD-10-CM | POA: Insufficient documentation

## 2014-07-05 NOTE — Patient Instructions (Signed)
Please increase fluids.  Go downstairs for imaging. I will call with results. We will treat based on imaging results and urine culture.

## 2014-07-05 NOTE — Assessment & Plan Note (Signed)
Urine dip unremarkable.  Sediment noted in urine.  Will send for micro and culture.  Will obtain KUB to assess for stone.

## 2014-07-05 NOTE — Progress Notes (Signed)
Patient presents to clinic today c/o sediment in her urine over the past few days.  Denies urinary urgency, frequency, hematuria, suprapubic pressure, fever, chills, nausea or vomiting.  Has had some mild and intermittent low back pain. Denies recent trauma or injury.  Denies hx of renal stone.  Past Medical History  Diagnosis Date  . History of chicken pox   . Environmental allergies   . UTI (lower urinary tract infection)     Current Outpatient Prescriptions on File Prior to Visit  Medication Sig Dispense Refill  . Calcium-Vitamin D 600-200 MG-UNIT per tablet Take 1 tablet by mouth daily.     No current facility-administered medications on file prior to visit.    No Known Allergies  Family History  Problem Relation Age of Onset  . Hypertension Father     Living  . Heart Problems Father   . Healthy Mother     Living  . Heart disease Paternal Grandfather   . Arthritis Paternal Aunt   . Healthy Brother     x1  . Healthy Son     x1  . Healthy Daughter     x1    History   Social History  . Marital Status: Married    Spouse Name: N/A    Number of Children: N/A  . Years of Education: N/A   Social History Main Topics  . Smoking status: Never Smoker   . Smokeless tobacco: None  . Alcohol Use: 1.2 oz/week    2 Glasses of wine per week     Comment: MODERATE  . Drug Use: None  . Sexual Activity: Yes    Birth Control/ Protection: None   Other Topics Concern  . None   Social History Narrative    Review of Systems - See HPI.  All other ROS are negative.  BP 97/64 mmHg  Pulse 62  Temp(Src) 98.2 F (36.8 C) (Oral)  Resp 16  Ht 5' 8.25" (1.734 m)  Wt 148 lb 8 oz (67.359 kg)  BMI 22.40 kg/m2  SpO2 100%  LMP 06/23/2014  Physical Exam  Constitutional: She is oriented to person, place, and time and well-developed, well-nourished, and in no distress.  HENT:  Head: Normocephalic and atraumatic.  Eyes: Conjunctivae are normal.  Cardiovascular: Normal rate,  regular rhythm, normal heart sounds and intact distal pulses.   Pulmonary/Chest: Effort normal and breath sounds normal. No respiratory distress. She has no wheezes. She has no rales. She exhibits no tenderness.  Abdominal: Soft. Bowel sounds are normal. She exhibits no distension and no mass. There is no tenderness. There is no rebound and no guarding.  Negative CVA tenderness.  Neurological: She is alert and oriented to person, place, and time.  Skin: Skin is warm and dry. No rash noted.  Psychiatric: Affect normal.  Vitals reviewed.   Recent Results (from the past 2160 hour(s))  POCT urinalysis dipstick     Status: None   Collection Time: 06/12/14  2:53 PM  Result Value Ref Range   Color, UA straw    Clarity, UA hazy     Comment: Taking AZO   Glucose, UA neg    Bilirubin, UA neg    Ketones, UA neg    Spec Grav, UA 1.015    Blood, UA negative    pH, UA 6.0    Protein, UA neg    Urobilinogen, UA 0.2    Nitrite, UA neg    Leukocytes, UA small (1+)   CULTURE, URINE  COMPREHENSIVE     Status: None   Collection Time: 06/12/14  3:18 PM  Result Value Ref Range   Colony Count NO GROWTH    Organism ID, Bacteria NO GROWTH   CULTURE, URINE COMPREHENSIVE     Status: None   Collection Time: 07/03/14 11:29 AM  Result Value Ref Range   Colony Count NO GROWTH    Organism ID, Bacteria NO GROWTH   Urine Microscopic Only     Status: None   Collection Time: 07/03/14 11:29 AM  Result Value Ref Range   WBC, UA 0-2/hpf 0-2/hpf   RBC / HPF none seen 0-2/hpf   Squamous Epithelial / LPF Rare(0-4/hpf) ZESP(2-3/RAQ)  Basic Metabolic Panel (BMET)     Status: Abnormal   Collection Time: 07/03/14 11:29 AM  Result Value Ref Range   Sodium 136 135 - 145 mEq/L   Potassium 4.2 3.5 - 5.1 mEq/L   Chloride 106 96 - 112 mEq/L   CO2 23 19 - 32 mEq/L   Glucose, Bld 87 70 - 99 mg/dL   BUN 20 6 - 23 mg/dL   Creatinine, Ser 1.1 0.4 - 1.2 mg/dL   Calcium 9.3 8.4 - 10.5 mg/dL   GFR 56.21 (L) >60.00 mL/min    POCT urinalysis dipstick     Status: None   Collection Time: 07/03/14 11:35 AM  Result Value Ref Range   Color, UA gold    Clarity, UA clear    Glucose, UA neg    Bilirubin, UA neg    Ketones, UA neg    Spec Grav, UA 1.025    Blood, UA neg    pH, UA 5.5    Protein, UA neg    Urobilinogen, UA 0.2    Nitrite, UA neg    Leukocytes, UA Negative     Assessment/Plan: Abnormal urine sediment Urine dip unremarkable.  Sediment noted in urine.  Will send for micro and culture.  Will obtain KUB to assess for stone.

## 2014-08-08 ENCOUNTER — Other Ambulatory Visit: Payer: Self-pay | Admitting: Gynecology

## 2014-08-08 ENCOUNTER — Ambulatory Visit (INDEPENDENT_AMBULATORY_CARE_PROVIDER_SITE_OTHER): Payer: 59 | Admitting: Gynecology

## 2014-08-08 ENCOUNTER — Encounter: Payer: Self-pay | Admitting: Gynecology

## 2014-08-08 ENCOUNTER — Ambulatory Visit (INDEPENDENT_AMBULATORY_CARE_PROVIDER_SITE_OTHER): Payer: 59

## 2014-08-08 VITALS — BP 120/76

## 2014-08-08 DIAGNOSIS — N83201 Unspecified ovarian cyst, right side: Secondary | ICD-10-CM

## 2014-08-08 DIAGNOSIS — N838 Other noninflammatory disorders of ovary, fallopian tube and broad ligament: Secondary | ICD-10-CM

## 2014-08-08 DIAGNOSIS — R8290 Unspecified abnormal findings in urine: Secondary | ICD-10-CM

## 2014-08-08 DIAGNOSIS — N8320 Unspecified ovarian cysts: Secondary | ICD-10-CM

## 2014-08-08 DIAGNOSIS — N839 Noninflammatory disorder of ovary, fallopian tube and broad ligament, unspecified: Secondary | ICD-10-CM

## 2014-08-08 LAB — URINALYSIS W MICROSCOPIC + REFLEX CULTURE
Bilirubin Urine: NEGATIVE
Glucose, UA: NEGATIVE mg/dL
Hgb urine dipstick: NEGATIVE
Ketones, ur: NEGATIVE mg/dL
Leukocytes, UA: NEGATIVE
Nitrite: NEGATIVE
Protein, ur: NEGATIVE mg/dL
Specific Gravity, Urine: 1.015 (ref 1.005–1.030)
Urobilinogen, UA: 0.2 mg/dL (ref 0.0–1.0)
pH: 5.5 (ref 5.0–8.0)

## 2014-08-08 NOTE — Patient Instructions (Signed)

## 2014-08-08 NOTE — Progress Notes (Signed)
Patient is a 44 year old who presented to the office today for follow-up ultrasound in reference to a persistent right ovarian cyst. Her history is as follows:  Patient was seen in the office as a new patient in May 2015. Patient reported at that time that she had not seen a gynecologist in over 3 years and [place]. Since she had complained of some irregular bleeding and ultrasound/sono hysterogram/endometrial biopsy was obtained with the following result:  Uterus measures 7.8 x 5.0 x 4.9 cm with endometrial stripe of 12 mm. The patient was found to have on her right ovary 2 solid calcified focal masses posterior wall shadowing effect was also noted measuring 19 x 21 mm and a second one 21 x 20 mm which was avascular. A millimeter cyst internal level echoes was noted also. Left ovarian follicles with echo-free measuring 29 x 21 mm, and 27 x 22 mm. There was free fluid noted in the left ovary.  CA 125 was 6  That office visit patient had a negative sonohysterogram in her endometrial biopsy was benign. She did have a cervical polyp that was removed that may have been contributing to her irregular cycle which was benign also. As a result of the above-mentioned cyst noted a ROMA-1 ovarian cancer screening was ordered in the phone results were noted:  Ref Range 32mo ago ROMA Premenopausal <1.31  1.45 (H)   ROMA Postmenopausal <2.77  0.86   CA125 <35 U/mL  6   HE4 <151 pM  70          She was referred to the GYN oncologist Dr. Charlaine Dalton for consultation. His assessment was as follows: "Small right ovarian cyst with calcification found incidentally on ultrasound. R OMA score is slightly elevated. Options were discussed with the patient and her husband. Basically we continued to observe this lesion which I believe is likely benign. Or alternatively, recommend laparoscopic right salpingo-oophorectomy. After discussing the pros and cons of each approach, the  patient and her husband are comfortable with continuing observation. Therefore recommend she have repeat ultrasound approximately 3 months. She tells me she has one already scheduled with Dr. Rudi Rummage. As long as there is no significant change, I recommend a followup scan 6 months later. If on the other hand there is continuing enlargement or changing characteristics of the cysts and light see the patient back for further evaluation."  Patient had a follow-up ultrasound 02/12/2014 with the following findings: Uterus measured 8.6 x 5.7 x 4.0 cm with endometrial stripe of 2.1 mm. Uterus is retroverted. Right ovary with 2 solid mass with calcification in the posterior wall of the uterus one measured 17 x 18 x 15 mm 17 mm mean, the second one measured 21 x 18 x 22 mm mean 20 mm. No change from previous study reported. It was reported to be avascular. With thick wall cyst measuring 11 x 11 mm 16 x 8 mm negative color flow. Left ovary was reportedly normal no fluid in the cul-de-sac.  She is here for follow-up ultrasound with the following noted: Uterus measured 9.4 x 6.4 x 4.4 cm with endometrial stripe of 10 mm. (Last menstrual cycle 07/20/2014). Right ovary with persistent 2 solid masses calcified walls measuring 21 x 20 mm, 18 x 18 mm both with positive color flow to the mass. Arterial blood flow seen the ovary. A third hypoechoic mass measuring 11 x 10 mm with negative color flow was noted. A left ovarian corpus luteum cyst measuring  21 x 15 mm with positive color flow and the periphery was noted. No fluid in the cul-de-sac.  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 We will recheck her ROMA-1 ovarian cancer screening blood test today. We will make arrangements to proceed with a recommendation for laparoscopic right salpingo-oophorectomy and left salpingectomy in an outpatient procedure. Literature information was provided. We'll see her for preoperative exam the week  prior to her surgery.

## 2014-08-16 LAB — OVARIAN MALIGNANCY RISK-ROMA
CA125: 6 U/mL (ref <35)
HE4: 37 pM (ref <151)
ROMA POSTMENOPAUSAL: 0.46 (ref <2.77)
ROMA Premenopausal: 0.36 (ref <1.31)

## 2014-08-27 ENCOUNTER — Encounter (HOSPITAL_COMMUNITY)
Admission: RE | Admit: 2014-08-27 | Discharge: 2014-08-27 | Disposition: A | Discharge status: Home / Self Care | Payer: 59 | Source: Ambulatory Visit | Attending: Gynecology | Admitting: Gynecology

## 2014-08-27 ENCOUNTER — Encounter (HOSPITAL_COMMUNITY): Payer: Self-pay

## 2014-08-27 DIAGNOSIS — Z01818 Encounter for other preprocedural examination: Secondary | ICD-10-CM | POA: Diagnosis present

## 2014-08-27 DIAGNOSIS — N8320 Unspecified ovarian cysts: Secondary | ICD-10-CM | POA: Diagnosis not present

## 2014-08-27 LAB — CBC
HCT: 39 % (ref 36.0–46.0)
HEMOGLOBIN: 12.8 g/dL (ref 12.0–15.0)
MCH: 30.8 pg (ref 26.0–34.0)
MCHC: 32.8 g/dL (ref 30.0–36.0)
MCV: 94 fL (ref 78.0–100.0)
PLATELETS: 226 10*3/uL (ref 150–400)
RBC: 4.15 MIL/uL (ref 3.87–5.11)
RDW: 12.3 % (ref 11.5–15.5)
WBC: 7.1 10*3/uL (ref 4.0–10.5)

## 2014-08-27 NOTE — Pre-Procedure Instructions (Signed)
Patient would like to discuss procedure with physician before signing consent form.  Will sign DOS Ricard Dillon, RN

## 2014-08-27 NOTE — Patient Instructions (Addendum)
   Your procedure is scheduled on:  Tuesday, March 15  Enter through the Main Entrance of [facility] at:  6 AM Pick up the phone at the desk and dial [phone] and inform us of your arrival.  Please call this number if you have any problems the morning of surgery: [phone]  Remember: Do not eat or drink after midnight: Monday Take these medicines the morning of surgery with a SIP OF WATER:  None  Do not wear jewelry, make-up, or FINGER nail polish No metal in your hair or on your body. Do not wear lotions, powders, perfumes.  You may wear deodorant.  Do not bring valuables to the hospital. Contacts, dentures or bridgework may not be worn into surgery.  Patients discharged on the day of surgery will not be allowed to drive home.  Home with husband.

## 2014-08-31 ENCOUNTER — Ambulatory Visit (INDEPENDENT_AMBULATORY_CARE_PROVIDER_SITE_OTHER): Payer: 59 | Admitting: Gynecology

## 2014-08-31 ENCOUNTER — Encounter: Payer: Self-pay | Admitting: Gynecology

## 2014-08-31 ENCOUNTER — Telehealth: Payer: Self-pay

## 2014-08-31 VITALS — BP 114/70

## 2014-08-31 DIAGNOSIS — Z01818 Encounter for other preprocedural examination: Secondary | ICD-10-CM

## 2014-08-31 DIAGNOSIS — R8299 Other abnormal findings in urine: Secondary | ICD-10-CM

## 2014-08-31 DIAGNOSIS — N9489 Other specified conditions associated with female genital organs and menstrual cycle: Secondary | ICD-10-CM

## 2014-08-31 DIAGNOSIS — N949 Unspecified condition associated with female genital organs and menstrual cycle: Secondary | ICD-10-CM | POA: Diagnosis not present

## 2014-08-31 DIAGNOSIS — R8290 Unspecified abnormal findings in urine: Secondary | ICD-10-CM

## 2014-08-31 MED ORDER — OXYCODONE-ACETAMINOPHEN 7.5-325 MG PO TABS: 1.0000 | ORAL_TABLET | ORAL | Status: DC | PRN

## 2014-08-31 MED ORDER — METOCLOPRAMIDE HCL 10 MG PO TABS: 10.0000 mg | ORAL_TABLET | Freq: Three times a day (TID) | ORAL | Status: DC

## 2014-08-31 NOTE — Progress Notes (Signed)
Shari George is an 44 y.o. female. Who presented to the office today for preoperative examination. Patient scheduled next week to undergo laparoscopic right salpingo-oophorectomy and left salpingectomy. Her history is as follows: Patient was seen in the office as a new patient in May 2015. Patient reported at that time that she had not seen a gynecologist in over 3 years and [place]. Since she had complained of some irregular bleeding and ultrasound/sono hysterogram/endometrial biopsy was obtained with the following result:  Uterus measures 7.8 x 5.0 x 4.9 cm with endometrial stripe of 12 mm. The patient was found to have on her right ovary 2 solid calcified focal masses posterior wall shadowing effect was also noted measuring 19 x 21 mm and a second one 21 x 20 mm which was avascular. A millimeter cyst internal level echoes was noted also. Left ovarian follicles with echo-free measuring 29 x 21 mm, and 27 x 22 mm. There was free fluid noted in the left ovary.  CA 125 was 6  That office visit patient had a negative sonohysterogram in her endometrial biopsy was benign. She did have a cervical polyp that was removed that may have been contributing to her irregular cycle which was benign also. As a result of the above-mentioned cyst noted a ROMA-1 ovarian cancer screening was ordered in the phone results were noted:  Ref Range 71mo ago ROMA Premenopausal <1.31  1.45 (H)   ROMA Postmenopausal <2.77  0.86   CA125 <35 U/mL  6   HE4 <151 pM  70          She was referred to the GYN oncologist Dr. Charlaine Dalton for consultation. His assessment was as follows: "Small right ovarian cyst with calcification found incidentally on ultrasound. R OMA score is slightly elevated. Options were discussed with the patient and her husband. Basically we continued to observe this lesion which I believe is likely benign. Or alternatively, recommend laparoscopic  right salpingo-oophorectomy. After discussing the pros and cons of each approach, the patient and her husband are comfortable with continuing observation. Therefore recommend she have repeat ultrasound approximately 3 months. She tells me she has one already scheduled with Dr. Rudi Rummage. As long as there is no significant change, I recommend a followup scan 6 months later. If on the other hand there is continuing enlargement or changing characteristics of the cysts and light see the patient back for further evaluation."  Patient had a follow-up ultrasound 02/12/2014 with the following findings: Uterus measured 8.6 x 5.7 x 4.0 cm with endometrial stripe of 2.1 mm. Uterus is retroverted. Right ovary with 2 solid mass with calcification in the posterior wall of the uterus one measured 17 x 18 x 15 mm 17 mm mean, the second one measured 21 x 18 x 22 mm mean 20 mm. No change from previous study reported. It was reported to be avascular. With thick wall cyst measuring 11 x 11 mm 16 x 8 mm negative color flow. Left ovary was reportedly normal no fluid in the cul-de-sac.  Her follow-up ultrasound on 08/08/2014 demonstrated the following noted: Uterus measured 9.4 x 6.4 x 4.4 cm with endometrial stripe of 10 mm. (Last menstrual cycle 07/20/2014). Right ovary with persistent 2 solid masses calcified walls measuring 21 x 20 mm, 18 x 18 mm both with positive color flow to the mass. Arterial blood flow seen the ovary. A third hypoechoic mass measuring 11 x 10 mm with negative color flow was noted. A left  ovarian corpus luteum cyst measuring 21 x 15 mm with positive color flow and the periphery was noted. No fluid in the cul-de-sac.      Her follow-up ROMA-1 blood test was normal.  Pertinent Gynecological History: Menses: Regular cycles Bleeding: Regular cycles  Contraception: condoms DES exposure: denies Blood transfusions: none Sexually transmitted diseases: no past history Previous GYN Procedures: 2 normal  vaginal deliveries  Last mammogram: normal 2015  Last pap: normal Date2015 OB Hi2 , P 2  Menstrual History: Menarche age 71 Patient's last menstrual period was 08/12/2014.    Past Medical History  Diagnosis Date  . History of chicken pox   . Environmental allergies   . UTI (lower urinary tract infection)     Past Surgical History  Procedure Laterality Date  . Knee surgery Left   . Left  knee   1988    " Bone lying on muscle"     Family History  Problem Relation Age of Onset  . Hypertension Father     Living  . Heart Problems Father   . Healthy Mother     Living  . Heart disease Paternal Grandfather   . Arthritis Paternal Aunt   . Healthy Brother     x1  . Healthy Son     x1  . Healthy Daughter     x1    Social History:  reports that she has never smoked. She does not have any smokeless tobacco history on file. She reports that she drinks about 1.2 oz of alcohol per week. Her drug history is not on file.  Allergies: No Known Allergies   (Not in a hospital admission)  REVIEW OF SYSTEMS: A ROS was performed and pertinent positives and negatives are included in the history.  GENERAL: No fevers or chills. HEENT: No change in vision, no earache, sore throat or sinus congestion. NECK: No pain or stiffness. CARDIOVASCULAR: No chest pain or pressure. No palpitations. PULMONARY: No shortness of breath, cough or wheeze. GASTROINTESTINAL: No abdominal pain, nausea, vomiting or diarrhea, melena or bright red blood per rectum. GENITOURINARY: No urinary frequency, urgency, hesitancy or dysuria. MUSCULOSKELETAL: No joint or muscle pain, no back pain, no recent trauma. DERMATOLOGIC: No rash, no itching, no lesions. ENDOCRINE: No polyuria, polydipsia, no heat or cold intolerance. No recent change in weight. HEMATOLOGICAL: No anemia or easy bruising or bleeding. NEUROLOGIC: No headache, seizures, numbness, tingling or weakness. PSYCHIATRIC: No depression, no loss of interest in normal  activity or change in sleep pattern.     Blood pressure 114/70, last menstrual period 08/12/2014.  Physical Exam:  HEENT:unremarkable Neck:Supple, midline, no thyroid megaly, no carotid bruits Lungs:  Clear to auscultation no rhonchi's or wheezes Heart:Regular rate and rhythm, no murmurs or gallops Breast Esymmetrical in appearance no palpable masses or tenderness no supra clavicular axillary lymphadenopathy  Abdomen:Soft nontender no rebound or guardinglvic: BUS within normal limits  [place] lesions or discharge Cervix: No lesions or discharge  Uterus: Anteverted normal size shape and consistency  Adnexa no masses or tenderness  Extremities: No cords, no edema Rectanot done    Assessment/Plan: Patient with persistent calcified right ovarian lesion scheduled to undergo laparoscopic right salpingo-oophorectomy and left salpingectomy. The following risk were discussed                        Patient was counseled as to the risk of surgery to include the following:  1. Infection (prohylactic antibiotics will be administered)  2. DVT/Pulmonary  Embolism (prophylactic pneumo compression stockings will be used)  3.Trauma to internal organs requiring additional surgical procedure to repair any injury to     Internal organs requiring perhaps additional hospitalization days.  4.Hemmorhage requiring transfusion and blood products which carry risks such as             anaphylactic reaction, hepatitis and AIDS  Patient had received literature information on the procedure scheduled and all her questions were answered and fully accepts all risk.  Patient was complaining of cloudy urine today although she had no dysuria or frequency or back pain or fever or chills or nausea or vomiting. A urinalysis will be ordered today.   [facility] HMD10:50 AMTD@Note :

## 2014-08-31 NOTE — Telephone Encounter (Signed)
I faxed clinicals for prior auth with [place] on 08/22/14 and received fax confirmation slip.  On 3/8 I called back because I had not heard anything and they told me no clinical records received. I re-faxed them directly to a lady named Embi who gave me her direct fax # and promised to direct them to where they needed to go.  On 08/30/14 when I still had not heard anything i refaxed Embi and let her know I still had not heard. Today I called again and spoke with Tressie Ellis and stressed my concern over this and the upcoming surgery date on 3/15.  He assured me it has been approved and I should be receiving a fax.  He said there is no authorization number and the Call Ref #[id number] is the only number.  I called [place] at the [facility] to let her know. She has also been calling. She spoke with a lady yesterday who told her it has been approved and sent her a fax.  She gave her authorization #[id number].

## 2014-09-01 LAB — URINALYSIS W MICROSCOPIC + REFLEX CULTURE
Bacteria, UA: NONE SEEN
Bilirubin Urine: NEGATIVE
CASTS: NONE SEEN
Crystals: NONE SEEN
Glucose, UA: NEGATIVE mg/dL
Hgb urine dipstick: NEGATIVE
KETONES UR: NEGATIVE mg/dL
Leukocytes, UA: NEGATIVE
NITRITE: NEGATIVE
PH: 7 (ref 5.0–8.0)
Protein, ur: NEGATIVE mg/dL
Specific Gravity, Urine: 1.005 (ref 1.005–1.030)
Squamous Epithelial / LPF: NONE SEEN
Urobilinogen, UA: 0.2 mg/dL (ref 0.0–1.0)

## 2014-09-03 MED ORDER — DEXTROSE 5 % IV SOLN
2.0000 g | INTRAVENOUS | Status: AC
  Administered 2014-09-04: 2 g via INTRAVENOUS
  Filled 2014-09-03: qty 2

## 2014-09-04 ENCOUNTER — Encounter (HOSPITAL_COMMUNITY): Payer: Self-pay | Admitting: Certified Registered Nurse Anesthetist

## 2014-09-04 ENCOUNTER — Ambulatory Visit (HOSPITAL_COMMUNITY): Payer: 59 | Admitting: Certified Registered Nurse Anesthetist

## 2014-09-04 ENCOUNTER — Ambulatory Visit (HOSPITAL_COMMUNITY)
Admission: RE | Admit: 2014-09-04 | Discharge: 2014-09-04 | Disposition: A | Discharge status: Home / Self Care | Payer: 59 | Source: Ambulatory Visit | Attending: Gynecology | Admitting: Gynecology

## 2014-09-04 ENCOUNTER — Encounter (HOSPITAL_COMMUNITY)
Admission: RE | Disposition: A | Discharge status: Home / Self Care | Payer: Self-pay | Source: Ambulatory Visit | Attending: Gynecology

## 2014-09-04 DIAGNOSIS — N8320 Unspecified ovarian cysts: Secondary | ICD-10-CM | POA: Insufficient documentation

## 2014-09-04 DIAGNOSIS — N838 Other noninflammatory disorders of ovary, fallopian tube and broad ligament: Secondary | ICD-10-CM | POA: Diagnosis not present

## 2014-09-04 HISTORY — PX: LAPAROSCOPIC UNILATERAL SALPINGECTOMY: SHX5934

## 2014-09-04 HISTORY — PX: LAPAROSCOPIC SALPINGO OOPHERECTOMY: SHX5927

## 2014-09-04 LAB — URINALYSIS, ROUTINE W REFLEX MICROSCOPIC
Bilirubin Urine: NEGATIVE
GLUCOSE, UA: NEGATIVE mg/dL
Hgb urine dipstick: NEGATIVE
Ketones, ur: NEGATIVE mg/dL
Leukocytes, UA: NEGATIVE
Nitrite: NEGATIVE
Protein, ur: NEGATIVE mg/dL
Specific Gravity, Urine: 1.025 (ref 1.005–1.030)
UROBILINOGEN UA: 0.2 mg/dL (ref 0.0–1.0)
pH: 5.5 (ref 5.0–8.0)

## 2014-09-04 LAB — PREGNANCY, URINE: Preg Test, Ur: NEGATIVE

## 2014-09-04 SURGERY — SALPINGECTOMY, UNILATERAL, LAPAROSCOPIC
Anesthesia: General | Laterality: Right

## 2014-09-04 MED ORDER — PROPOFOL 10 MG/ML IV BOLUS
INTRAVENOUS | Status: DC | PRN
  Administered 2014-09-04: 200 mg via INTRAVENOUS

## 2014-09-04 MED ORDER — SCOPOLAMINE 1 MG/3DAYS TD PT72
MEDICATED_PATCH | TRANSDERMAL | Status: AC
  Administered 2014-09-04: 1.5 mg via TRANSDERMAL
  Filled 2014-09-04: qty 1

## 2014-09-04 MED ORDER — LIDOCAINE HCL (CARDIAC) 20 MG/ML IV SOLN
INTRAVENOUS | Status: DC | PRN
  Administered 2014-09-04: 50 mg via INTRAVENOUS

## 2014-09-04 MED ORDER — 0.9 % SODIUM CHLORIDE (POUR BTL) OPTIME
TOPICAL | Status: DC | PRN
  Administered 2014-09-04: 1000 mL

## 2014-09-04 MED ORDER — OXYCODONE HCL 5 MG PO TABS: 5.0000 mg | ORAL_TABLET | Freq: Once | ORAL | Status: DC | PRN

## 2014-09-04 MED ORDER — NEOSTIGMINE METHYLSULFATE 10 MG/10ML IV SOLN
INTRAVENOUS | Status: DC | PRN
  Administered 2014-09-04: 3 mg via INTRAVENOUS

## 2014-09-04 MED ORDER — NEOSTIGMINE METHYLSULFATE 10 MG/10ML IV SOLN
INTRAVENOUS | Status: AC
  Filled 2014-09-04: qty 1

## 2014-09-04 MED ORDER — ROCURONIUM BROMIDE 100 MG/10ML IV SOLN
INTRAVENOUS | Status: DC | PRN
  Administered 2014-09-04: 35 mg via INTRAVENOUS

## 2014-09-04 MED ORDER — ONDANSETRON HCL 4 MG/2ML IJ SOLN
INTRAMUSCULAR | Status: AC
  Filled 2014-09-04: qty 2

## 2014-09-04 MED ORDER — KETOROLAC TROMETHAMINE 30 MG/ML IJ SOLN
INTRAMUSCULAR | Status: DC | PRN
  Administered 2014-09-04: 30 mg via INTRAVENOUS

## 2014-09-04 MED ORDER — PROPOFOL 10 MG/ML IV BOLUS
INTRAVENOUS | Status: AC
  Filled 2014-09-04: qty 20

## 2014-09-04 MED ORDER — SILVER NITRATE-POT NITRATE 75-25 % EX MISC
CUTANEOUS | Status: AC
  Filled 2014-09-04: qty 1

## 2014-09-04 MED ORDER — BUPIVACAINE HCL (PF) 0.25 % IJ SOLN
INTRAMUSCULAR | Status: DC | PRN
  Administered 2014-09-04: 14 mL

## 2014-09-04 MED ORDER — MEPERIDINE HCL 25 MG/ML IJ SOLN: 6.2500 mg | INTRAMUSCULAR | Status: DC | PRN

## 2014-09-04 MED ORDER — SCOPOLAMINE 1 MG/3DAYS TD PT72
1.0000 | MEDICATED_PATCH | Freq: Once | TRANSDERMAL | Status: DC
  Administered 2014-09-04: 1.5 mg via TRANSDERMAL

## 2014-09-04 MED ORDER — DEXAMETHASONE SODIUM PHOSPHATE 4 MG/ML IJ SOLN
INTRAMUSCULAR | Status: AC
  Filled 2014-09-04: qty 1

## 2014-09-04 MED ORDER — FENTANYL CITRATE 0.05 MG/ML IJ SOLN
INTRAMUSCULAR | Status: AC
  Filled 2014-09-04: qty 5

## 2014-09-04 MED ORDER — LIDOCAINE HCL (PF) 1 % IJ SOLN
INTRAMUSCULAR | Status: AC
  Filled 2014-09-04: qty 5

## 2014-09-04 MED ORDER — GLYCOPYRROLATE 0.2 MG/ML IJ SOLN
INTRAMUSCULAR | Status: DC | PRN
  Administered 2014-09-04: 0.4 mg via INTRAVENOUS
  Administered 2014-09-04: 0.2 mg via INTRAVENOUS

## 2014-09-04 MED ORDER — DEXAMETHASONE SODIUM PHOSPHATE 10 MG/ML IJ SOLN
INTRAMUSCULAR | Status: DC | PRN
  Administered 2014-09-04: 4 mg via INTRAVENOUS

## 2014-09-04 MED ORDER — LIDOCAINE HCL (CARDIAC) 20 MG/ML IV SOLN
INTRAVENOUS | Status: AC
  Filled 2014-09-04: qty 5

## 2014-09-04 MED ORDER — LACTATED RINGERS IR SOLN
Status: DC | PRN
  Administered 2014-09-04: 3000 mL

## 2014-09-04 MED ORDER — MIDAZOLAM HCL 2 MG/2ML IJ SOLN
INTRAMUSCULAR | Status: AC
  Filled 2014-09-04: qty 2

## 2014-09-04 MED ORDER — BUPIVACAINE HCL (PF) 0.25 % IJ SOLN
INTRAMUSCULAR | Status: AC
  Filled 2014-09-04: qty 60

## 2014-09-04 MED ORDER — KETOROLAC TROMETHAMINE 30 MG/ML IJ SOLN
INTRAMUSCULAR | Status: AC
  Filled 2014-09-04: qty 1

## 2014-09-04 MED ORDER — LACTATED RINGERS IV SOLN
INTRAVENOUS | Status: DC
  Administered 2014-09-04 (×2): via INTRAVENOUS

## 2014-09-04 MED ORDER — FENTANYL CITRATE 0.05 MG/ML IJ SOLN: 25.0000 ug | INTRAMUSCULAR | Status: DC | PRN

## 2014-09-04 MED ORDER — FENTANYL CITRATE 0.05 MG/ML IJ SOLN
INTRAMUSCULAR | Status: DC | PRN
  Administered 2014-09-04 (×2): 50 ug via INTRAVENOUS
  Administered 2014-09-04: 25 ug via INTRAVENOUS
  Administered 2014-09-04: 50 ug via INTRAVENOUS

## 2014-09-04 MED ORDER — MIDAZOLAM HCL 2 MG/2ML IJ SOLN
INTRAMUSCULAR | Status: DC | PRN
  Administered 2014-09-04: 2 mg via INTRAVENOUS

## 2014-09-04 MED ORDER — METOCLOPRAMIDE HCL 5 MG/ML IJ SOLN: 10.0000 mg | Freq: Once | INTRAMUSCULAR | Status: DC | PRN

## 2014-09-04 MED ORDER — ONDANSETRON HCL 4 MG/2ML IJ SOLN
INTRAMUSCULAR | Status: DC | PRN
  Administered 2014-09-04: 4 mg via INTRAVENOUS

## 2014-09-04 MED ORDER — OXYCODONE HCL 5 MG/5ML PO SOLN: 5.0000 mg | Freq: Once | ORAL | Status: DC | PRN

## 2014-09-04 MED ORDER — GLYCOPYRROLATE 0.2 MG/ML IJ SOLN
INTRAMUSCULAR | Status: AC
  Filled 2014-09-04: qty 3

## 2014-09-04 SURGICAL SUPPLY — 29 items
BARRIER ADHS 3X4 INTERCEED (GAUZE/BANDAGES/DRESSINGS) IMPLANT
CABLE HIGH FREQUENCY MONO STRZ (ELECTRODE) IMPLANT
CLOTH BEACON ORANGE TIMEOUT ST (SAFETY) ×4 IMPLANT
COVER MAYO STAND STRL (DRAPES) ×4 IMPLANT
DRSG COVADERM PLUS 2X2 (GAUZE/BANDAGES/DRESSINGS) ×8 IMPLANT
DRSG OPSITE POSTOP 3X4 (GAUZE/BANDAGES/DRESSINGS) IMPLANT
FILTER SMOKE EVAC LAPAROSHD (FILTER) ×4 IMPLANT
GLOVE BIOGEL PI IND STRL 8 (GLOVE) ×2 IMPLANT
GLOVE BIOGEL PI INDICATOR 8 (GLOVE) ×2
GLOVE ECLIPSE 7.5 STRL STRAW (GLOVE) ×8 IMPLANT
GOWN STRL REUS W/TWL LRG LVL3 (GOWN DISPOSABLE) ×8 IMPLANT
LIQUID BAND (GAUZE/BANDAGES/DRESSINGS) IMPLANT
NS IRRIG 1000ML POUR BTL (IV SOLUTION) ×4 IMPLANT
PACK LAPAROSCOPY BASIN (CUSTOM PROCEDURE TRAY) ×4 IMPLANT
PAD POSITIONER PINK NONSTERILE (MISCELLANEOUS) ×4 IMPLANT
POUCH SPECIMEN RETRIEVAL 10MM (ENDOMECHANICALS) ×4 IMPLANT
PROTECTOR NERVE ULNAR (MISCELLANEOUS) ×4 IMPLANT
SET IRRIG TUBING LAPAROSCOPIC (IRRIGATION / IRRIGATOR) ×4 IMPLANT
SHEARS HARMONIC ACE PLUS 36CM (ENDOMECHANICALS) ×4 IMPLANT
SLEEVE XCEL OPT CAN 5 100 (ENDOMECHANICALS) ×4 IMPLANT
SOLUTION ELECTROLUBE (MISCELLANEOUS) IMPLANT
SUT VIC AB 3-0 PS2 18 (SUTURE) ×2
SUT VIC AB 3-0 PS2 18XBRD (SUTURE) ×2 IMPLANT
SUT VICRYL 0 UR6 27IN ABS (SUTURE) ×4 IMPLANT
TOWEL OR 17X24 6PK STRL BLUE (TOWEL DISPOSABLE) ×8 IMPLANT
TRAY FOLEY CATH 14FR (SET/KITS/TRAYS/PACK) ×4 IMPLANT
TROCAR XCEL NON-BLD 11X100MML (ENDOMECHANICALS) ×4 IMPLANT
TROCAR XCEL NON-BLD 5MMX100MML (ENDOMECHANICALS) ×4 IMPLANT
WARMER LAPAROSCOPE (MISCELLANEOUS) ×4 IMPLANT

## 2014-09-04 NOTE — H&P (View-Only) (Signed)
Patient is a 44 year old who presented to the office today for follow-up ultrasound in reference to a persistent right ovarian cyst. Her history is as follows:  Patient was seen in the office as a new patient in May 2015. Patient reported at that time that she had not seen a gynecologist in over 3 years and [place]. Since she had complained of some irregular bleeding and ultrasound/sono hysterogram/endometrial biopsy was obtained with the following result:  Uterus measures 7.8 x 5.0 x 4.9 cm with endometrial stripe of 12 mm. The patient was found to have on her right ovary 2 solid calcified focal masses posterior wall shadowing effect was also noted measuring 19 x 21 mm and a second one 21 x 20 mm which was avascular. A millimeter cyst internal level echoes was noted also. Left ovarian follicles with echo-free measuring 29 x 21 mm, and 27 x 22 mm. There was free fluid noted in the left ovary.  CA 125 was 6  That office visit patient had a negative sonohysterogram in her endometrial biopsy was benign. She did have a cervical polyp that was removed that may have been contributing to her irregular cycle which was benign also. As a result of the above-mentioned cyst noted a ROMA-1 ovarian cancer screening was ordered in the phone results were noted:  Ref Range 83mo ago ROMA Premenopausal <1.31  1.45 (H)   ROMA Postmenopausal <2.77  0.86   CA125 <35 U/mL  6   HE4 <151 pM  70          She was referred to the GYN oncologist Dr. Charlaine Dalton for consultation. His assessment was as follows: "Small right ovarian cyst with calcification found incidentally on ultrasound. R OMA score is slightly elevated. Options were discussed with the patient and her husband. Basically we continued to observe this lesion which I believe is likely benign. Or alternatively, recommend laparoscopic right salpingo-oophorectomy. After discussing the pros and cons of each approach, the  patient and her husband are comfortable with continuing observation. Therefore recommend she have repeat ultrasound approximately 3 months. She tells me she has one already scheduled with Dr. Rudi Rummage. As long as there is no significant change, I recommend a followup scan 6 months later. If on the other hand there is continuing enlargement or changing characteristics of the cysts and light see the patient back for further evaluation."  Patient had a follow-up ultrasound 02/12/2014 with the following findings: Uterus measured 8.6 x 5.7 x 4.0 cm with endometrial stripe of 2.1 mm. Uterus is retroverted. Right ovary with 2 solid mass with calcification in the posterior wall of the uterus one measured 17 x 18 x 15 mm 17 mm mean, the second one measured 21 x 18 x 22 mm mean 20 mm. No change from previous study reported. It was reported to be avascular. With thick wall cyst measuring 11 x 11 mm 16 x 8 mm negative color flow. Left ovary was reportedly normal no fluid in the cul-de-sac.  She is here for follow-up ultrasound with the following noted: Uterus measured 9.4 x 6.4 x 4.4 cm with endometrial stripe of 10 mm. (Last menstrual cycle 07/20/2014). Right ovary with persistent 2 solid masses calcified walls measuring 21 x 20 mm, 18 x 18 mm both with positive color flow to the mass. Arterial blood flow seen the ovary. A third hypoechoic mass measuring 11 x 10 mm with negative color flow was noted. A left ovarian corpus luteum cyst measuring  21 x 15 mm with positive color flow and the periphery was noted. No fluid in the cul-de-sac.  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 We will recheck her ROMA-1 ovarian cancer screening blood test today. We will make arrangements to proceed with a recommendation for laparoscopic right salpingo-oophorectomy and left salpingectomy in an outpatient procedure. Literature information was provided. We'll see her for preoperative exam the week  prior to her surgery.

## 2014-09-04 NOTE — Transfer of Care (Signed)
Immediate Anesthesia Transfer of Care Note  Patient: Shari George  Procedure(s) Performed: Procedure(s): LAPAROSCOPIC UNILATERAL SALPINGECTOMY (Left) LAPAROSCOPIC SALPINGO OOPHORECTOMY (Right)  Patient Location: PACU  Anesthesia Type:General  Level of Consciousness: awake, alert  and oriented  Airway & Oxygen Therapy: Patient Spontanous Breathing and Patient connected to nasal cannula oxygen  Post-op Assessment: Report given to RN and Post -op Vital signs reviewed and stable  Post vital signs: Reviewed and stable  Last Vitals:  Filed Vitals:   09/04/14 0622  BP: 102/68  Pulse: 81  Temp: 36.7 C  Resp: 18    Complications: No apparent anesthesia complications

## 2014-09-04 NOTE — Anesthesia Procedure Notes (Signed)
Procedure Name: Intubation Date/Time: 09/04/2014 7:31 AM Performed by: Bufford Spikes Pre-anesthesia Checklist: Patient identified, Patient being monitored, Emergency Drugs available, Timeout performed and Suction available Patient Re-evaluated:Patient Re-evaluated prior to inductionOxygen Delivery Method: Circle system utilized Preoxygenation: Pre-oxygenation with 100% oxygen Intubation Type: IV induction Ventilation: Mask ventilation without difficulty Laryngoscope Size: Miller and 2 Grade View: Grade I Tube type: Oral Tube size: 7.0 mm Number of attempts: 1 Airway Equipment and Method: Stylet Placement Confirmation: ETT inserted through vocal cords under direct vision,  positive ETCO2 and breath sounds checked- equal and bilateral Secured at: 20 cm Tube secured with: Tape Dental Injury: Teeth and Oropharynx as per pre-operative assessment

## 2014-09-04 NOTE — Op Note (Signed)
Operative Note  09/04/2014  8:45 AM  PATIENT:  Shari George  44 y.o. female  PRE-OPERATIVE DIAGNOSIS:  right ovarian cyst  POST-OPERATIVE DIAGNOSIS:  right ovarian cyst, cystic distal left fallopian tube  PROCEDURE:  Procedure(s): LAPAROSCOPIC UNILATERAL SALPINGECTOMY (left) LAPAROSCOPIC SALPINGO OOPHORECTOMY (right)  SURGEON:  Surgeon(s): Terrance Mass, MD [name], MD  ANESTHESIA:   general  FINDINGS: Owens Shark this cystic lesion external surface of right ovary with normal right fallopian tube. Left distal fallopian tube cystic appearance brownish in color also. No abnormalities on the anterior posterior cul-de-sac. No lesions seen on omentum. Normal-appearing appendix. Normal smooth liver surface. Gallbladder not seen.  DESCRIPTION OF OPERATION: The patient was taken to the operating room where she underwent a successful general endotracheal anesthesia. A timeout was undertaken and the procedure to be undertaken was voiced out loud. Patient properly identified. Patient had received 2 g of Cefotan preoperative and had PAS stockings for DVT prophylaxis. The abdomen vagina and perineum were prepped and draped in usual sterile fashion. Exam under anesthesia demonstrated slightly retroverted uterus and a Hulka tenaculum was placed for manipulation of the uterus during laparoscopic procedure. A Foley catheter been inserted to monitor urinary output. A small vertical incision was made underneath the umbilicus followed by insertion of a 10/11 mm  Optiview trocar. Under laparoscopic guidance after a pneumoperitoneum was established 2 additional 5 mm ports were made in the right and left lower abdomen. A systematic inspection demonstrated the following:  Owens Shark this cystic lesion external surface of right ovary with normal right fallopian tube. Left distal fallopian tube cystic appearance brownish in color also. No abnormalities on the anterior posterior cul-de-sac. No lesions  seen on omentum. Normal-appearing appendix. Normal smooth liver surface. Gallbladder not seen.  Pelvic washings were obtained and submitted for cytological evaluation. Attention was placed on the right adnexa. The right ureter was identified. The right tube and ovary were placed 100 tension and the right infundibulopelvic ligament was coaptated and transected with Harmonic scalpel. The remainder of the nasal salpinx to the point near the uterotubal junction and utero-ovarian ligament was coaptated and transected thus freeing the right tube and ovary. Good hemostasis was present. The specimen was placed in the cul-de-sac as we proceeded to remove the left fallopian tube. The left ureter was also identified. The left fallopian tube was placed under tension and the nasal salpinx was coaptated and transected to the level of the tubal uterine region which was coaptated and transected does removing the left fallopian tube. Following this through one of the 5 mm ports the camera was placed and through the 10/11 mm port site the laparoscopic Endo basket was introduced and the right tube and ovary and left fallopian tube were removed passed off the operative field identified accordingly and submitted for histological evaluation along with the pelvic washings. A systematic inspection of the entire pelvic cavity after copiously irrigated with normal saline solution demonstrated adequate hemostasis. The pneumoperitoneum was removed. The subumbilical fascia was closed with a running stitch of 0 Vicryl suture and the subcutaneous tissue was reapproximated with 3-0 Vicryl suture. The edges of the skin on all 3 port sites were reapproximated with Dermabond glue. For postoperative analgesia 0.25% Marcaine was infiltrated all 3 port sites were proximally 15 cc. Patient received Toradol 30 mg IV in route to the recovery room after her Foley catheter was removed as well as the Hulka tenaculum. Patient tolerated procedure well. And  was extubated successfully and transferred to the recovery  room.     ESTIMATED  BLOOD LOSS: Minimal   Intake/Output Summary (Last 24 hours) at 09/04/14 0845 Last data filed at 09/04/14 0815  Gross per 24 hour  Intake   1000 ml  Output     40 ml  Net    960 ml     BLOOD ADMINISTERED:none   LOCAL MEDICATIONS USED:  MARCAINE   0.25% administered at the 3 incision ports for approximately 15 cc for postop analgesia  SPECIMEN:  Source of Specimen:  #1 pelvic washings #2 right fallopian tube and ovary #3 left fallopian tub3  DISPOSITION OF SPECIMEN:  PATHOLOGY  COUNTS:  YES  PLAN OF CARE: Transfer to PACU  [facility] HMD8:45 AMTD@

## 2014-09-04 NOTE — Discharge Instructions (Signed)
DISCHARGE INSTRUCTIONS: Laparoscopy  The following instructions have been prepared to help you care for yourself upon your return home today.  May remove Scop patch on or before 09/06/2014.  May take Ibuprofen after 2:11 p.m as needed for cramps/pain.  Take a stool softner and drink plenty of water while taking narcotic pain medication to prevent constipation.  Wound care:  Do not get the incision wet for the first 24 hours. The incision should be kept clean and dry.  The Band-Aids or dressings may be removed the day after surgery.  Should the incision become sore, red, and swollen after the first week, check with your doctor.  Personal hygiene:  Shower the day after your procedure.  Activity and limitations:  Do NOT drive or operate any equipment today.  Do NOT lift anything more than 15 pounds for 2-3 weeks after surgery.  Do NOT rest in bed all day.  Walking is encouraged. Walk each day, starting slowly with 5-minute walks 3 or 4 times a day. Slowly increase the length of your walks.  Walk up and down stairs slowly.  Do NOT do strenuous activities, such as golfing, playing tennis, bowling, running, biking, weight lifting, gardening, mowing, or vacuuming for 2-4 weeks. Ask your doctor when it is okay to start.  Diet: Eat a light meal as desired this evening. You may resume your usual diet tomorrow.  Return to work: This is dependent on the type of work you do. For the most part you can return to a desk job within a week of surgery. If you are more active at work, please discuss this with your doctor.  What to expect after your surgery: You may have a slight burning sensation when you urinate on the first day. You may have a very small amount of blood in the urine. Expect to have a small amount of vaginal discharge/light bleeding for 1-2 weeks. It is not unusual to have abdominal soreness and bruising for up to 2 weeks. You may be tired and need more rest for about 1 week. You  may experience shoulder pain for 24-72 hours. Lying flat in bed may relieve it.  Call your doctor for any of the following:  Develop a fever of 100.4 or greater  Inability to urinate 6 hours after discharge from hospital  Severe pain not relieved by pain medications  Persistent of heavy bleeding at incision site  Redness or swelling around incision site after a week  Increasing nausea or vomiting

## 2014-09-04 NOTE — Anesthesia Preprocedure Evaluation (Signed)
Anesthesia Evaluation  Patient identified by MRN, date of birth, ID band Patient awake    Reviewed: Allergy & Precautions, NPO status , Patient's Chart, lab work & pertinent test results  Airway Mallampati: III  TM Distance: >3 FB Neck ROM: Full    Dental no notable dental hx. (+) Teeth Intact   Pulmonary neg pulmonary ROS,  breath sounds clear to auscultation  Pulmonary exam normal       Cardiovascular negative cardio ROS  Rhythm:Regular Rate:Normal     Neuro/Psych negative neurological ROS  negative psych ROS   GI/Hepatic negative GI ROS, Neg liver ROS,   Endo/Other  negative endocrine ROS  Renal/GU negative Renal ROS  negative genitourinary   Musculoskeletal negative musculoskeletal ROS (+)   Abdominal   Peds  Hematology negative hematology ROS (+)   Anesthesia Other Findings   Reproductive/Obstetrics Right ovarian cyst                             Anesthesia Physical Anesthesia Plan  ASA: I  Anesthesia Plan: General   Post-op Pain Management:    Induction: Intravenous  Airway Management Planned: Oral ETT  Additional Equipment:   Intra-op Plan:   Post-operative Plan: Extubation in OR  Informed Consent: I have reviewed the patients History and Physical, chart, labs and discussed the procedure including the risks, benefits and alternatives for the proposed anesthesia with the patient or authorized representative who has indicated his/her understanding and acceptance.   Dental advisory given  Plan Discussed with: CRNA, Anesthesiologist and Surgeon  Anesthesia Plan Comments:         Anesthesia Quick Evaluation

## 2014-09-04 NOTE — Anesthesia Postprocedure Evaluation (Signed)
  Anesthesia Post-op Note  Patient: Shari George  Procedure(s) Performed: Procedure(s): LAPAROSCOPIC UNILATERAL SALPINGECTOMY (Left) LAPAROSCOPIC SALPINGO OOPHORECTOMY (Right)  Patient Location: PACU  Anesthesia Type:General  Level of Consciousness: awake, alert  and oriented  Airway and Oxygen Therapy: Patient Spontanous Breathing  Post-op Pain: none  Post-op Assessment: Post-op Vital signs reviewed, Patient's Cardiovascular Status Stable, Respiratory Function Stable, Patent Airway, No signs of Nausea or vomiting and Pain level controlled  Post-op Vital Signs: Reviewed and stable  Last Vitals:  Filed Vitals:   09/04/14 0915  BP:   Pulse:   Temp:   Resp: 16    Complications: No apparent anesthesia complications

## 2014-09-04 NOTE — Interval H&P Note (Signed)
History and Physical Interval Note:  09/04/2014 7:09 AM  Shari George  has presented today for surgery, with the diagnosis of right ovarian cyst  The various methods of treatment have been discussed with the patient and family. After consideration of risks, benefits and other options for treatment, the patient has consented to  Procedure(s): LAPAROSCOPIC UNILATERAL SALPINGECTOMY (Left) LAPAROSCOPIC SALPINGO OOPHORECTOMY (Right) as a surgical intervention .  The patient's history has been reviewed, patient examined, no change in status, stable for surgery.  I have reviewed the patient's chart and labs.  Questions were answered to the patient's satisfaction.     Terrance Mass

## 2014-09-05 ENCOUNTER — Encounter (HOSPITAL_COMMUNITY): Payer: Self-pay | Admitting: Gynecology

## 2014-09-24 ENCOUNTER — Ambulatory Visit (INDEPENDENT_AMBULATORY_CARE_PROVIDER_SITE_OTHER): Payer: 59 | Admitting: Gynecology

## 2014-09-24 ENCOUNTER — Encounter: Payer: Self-pay | Admitting: Gynecology

## 2014-09-24 VITALS — BP 116/70

## 2014-09-24 DIAGNOSIS — Z09 Encounter for follow-up examination after completed treatment for conditions other than malignant neoplasm: Secondary | ICD-10-CM

## 2014-09-24 NOTE — Progress Notes (Signed)
   Patient presented to the office today for 3 week postop visit. On 09/04/2014 patient underwent laparoscopic right salpingo-oophorectomy and left salpingectomy as a result of right ovarian cyst and findings at time of surgery. Patient is asymptomatic doing well. Pictures from her surgery as well as pathology report as follows were shared:  FINDINGS: Owens Shark this cystic lesion external surface of right ovary with normal right fallopian tube. Left distal fallopian tube cystic appearance brownish in color also. No abnormalities on the anterior posterior cul-de-sac. No lesions seen on omentum. Normal-appearing appendix. Normal smooth liver surface. Gallbladder not seen  Diagnosis 1. Ovary and fallopian tube, right - BENIGN OVARIAN FIBROMA, BENIGN CALCIFIED NODULE AND CORPUS LUTEUM. - BENIGN FALLOPIAN TUBE WITH BENIGN PARATUBAL CYST. - NO ENDOMETRIOSIS OR EVIDENCE OF MALIGNANCY. 2. Fallopian tube, left - BENIGN FALLOPIAN TUBE WITH BENIGN SURFACE ADENOFIBROMA. - NO ENDOMETRIOSIS OR EVIDENCE OF MALIGNANCY.  Exam: Blood pressure 116/70 Appearance: Well developed well nourished female in no acute distress Abdomen: Incision ports completely healed. Abdomen soft nontender no rebound or guarding Pelvic: Bartholin urethra Skene was within normal limits Vagina: No lesions or discharge Cervix: No lesions or discharge Uterus: Anteverted normal size shape and consistency Adnexa: No palpable masses or tenderness Rectal exam: Not done  Assessment/plan: Patient 3 weeks status post laparoscopic right salpingo-oophorectomy and left salpingectomy with benign pathology doing well. Patient resumed full normal activity. Patient scheduled for mammogram in May of this year. Patient otherwise scheduled to return back to the office in one year for annual exam or when necessary.

## 2014-10-15 ENCOUNTER — Other Ambulatory Visit: Payer: Self-pay

## 2014-10-15 DIAGNOSIS — Z1231 Encounter for screening mammogram for malignant neoplasm of breast: Secondary | ICD-10-CM

## 2014-11-05 ENCOUNTER — Ambulatory Visit
Admission: RE | Admit: 2014-11-05 | Discharge: 2014-11-05 | Disposition: A | Discharge status: Home / Self Care | Payer: 59 | Source: Ambulatory Visit

## 2014-11-05 DIAGNOSIS — Z1231 Encounter for screening mammogram for malignant neoplasm of breast: Secondary | ICD-10-CM

## 2015-05-06 ENCOUNTER — Encounter: Payer: Self-pay | Admitting: Physician Assistant

## 2015-05-06 ENCOUNTER — Ambulatory Visit (INDEPENDENT_AMBULATORY_CARE_PROVIDER_SITE_OTHER): Payer: 59 | Admitting: Physician Assistant

## 2015-05-06 VITALS — BP 106/62 | HR 58 | Temp 98.0°F | Resp 16 | Ht 66.0 in | Wt 149.1 lb

## 2015-05-06 DIAGNOSIS — T148 Other injury of unspecified body region: Secondary | ICD-10-CM | POA: Diagnosis not present

## 2015-05-06 DIAGNOSIS — W5581XA Bitten by other mammals, initial encounter: Secondary | ICD-10-CM

## 2015-05-06 DIAGNOSIS — T148XXA Other injury of unspecified body region, initial encounter: Secondary | ICD-10-CM

## 2015-05-06 DIAGNOSIS — Z23 Encounter for immunization: Secondary | ICD-10-CM | POA: Diagnosis not present

## 2015-05-06 NOTE — Assessment & Plan Note (Signed)
Not a true bite more of a superficial scratch from a pet. Low risk animal for rabies. Patient doing very well. No sign of infection. Discussed how to keep area clean and dry. No follow-up unless any new symptoms develop. No indication for PEP for rabies. Tetanus updated (TDaP).

## 2015-05-06 NOTE — Progress Notes (Signed)
Pre visit review using our clinic review tool, if applicable. No additional management support is needed unless otherwise documented below in the visit note/SLS  

## 2015-05-06 NOTE — Addendum Note (Signed)
Addended by: Rockwell Germany on: 05/06/2015 02:05 PM   Modules accepted: Orders

## 2015-05-06 NOTE — Progress Notes (Signed)
    Patient presents to clinic today c/o "bite" to right knee last week while jogging. States that she was jogging by a leashed dog with its owner, when the dog snipped at her knee. Owner was finally able to pull dog away. Patient noticed bright red blood and "scrape" at knee. Patient went home without getting information on the pet. Owner and pet were gone when she and her husband returned to the scene. Denies residual pain. Endorses the area seems like a scratch and not a bite and has mostly healed. Denies fever, chills, malaise.  Past Medical History  Diagnosis Date  . History of chicken pox   . Environmental allergies   . UTI (lower urinary tract infection)     Current Outpatient Prescriptions on File Prior to Visit  Medication Sig Dispense Refill  . Calcium-Vitamin D 600-200 MG-UNIT per tablet Take 1 tablet by mouth daily.    Marland Kitchen CRANBERRY PO Take 2 tablets by mouth daily.     No current facility-administered medications on file prior to visit.    No Known Allergies  Family History  Problem Relation Age of Onset  . Hypertension Father     Living  . Heart Problems Father   . Healthy Mother     Living  . Heart disease Paternal Grandfather   . Arthritis Paternal Aunt   . Healthy Brother     x1  . Healthy Son     x1  . Healthy Daughter     x1    Social History   Social History  . Marital Status: Married    Spouse Name: N/A  . Number of Children: N/A  . Years of Education: N/A   Social History Main Topics  . Smoking status: Never Smoker   . Smokeless tobacco: None  . Alcohol Use: 1.2 oz/week    2 Glasses of wine per week     Comment: MODERATE  . Drug Use: None  . Sexual Activity: Yes    Birth Control/ Protection: None   Other Topics Concern  . None   Social History Narrative   Review of Systems - See HPI.  All other ROS are negative.  BP 106/62 mmHg  Pulse 58  Temp(Src) 98 F (36.7 C) (Oral)  Resp 16  Ht 5\' 6"  (1.676 m)  Wt 149 lb 2 oz (67.643 kg)   BMI 24.08 kg/m2  SpO2 100%  LMP 05/01/2015  Physical Exam  Constitutional: She is oriented to person, place, and time and well-developed, well-nourished, and in no distress.  Cardiovascular: Normal rate, regular rhythm, normal heart sounds and intact distal pulses.   Pulmonary/Chest: Effort normal and breath sounds normal. No respiratory distress. She has no wheezes. She exhibits no tenderness.  Neurological: She is alert and oriented to person, place, and time.  Skin:     Vitals reviewed.   No results found for this or any previous visit (from the past 2160 hour(s)).  Assessment/Plan: Animal bite Not a true bite more of a superficial scratch from a pet. Low risk animal for rabies. Patient doing very well. No sign of infection. Discussed how to keep area clean and dry. No follow-up unless any new symptoms develop. No indication for PEP for rabies. Tetanus updated (TDaP).

## 2015-05-06 NOTE — Patient Instructions (Signed)
Please keep area clean and dry.  Thankfully there is not puncture wound, only a small, superficial abrasion. Do not think that risk of rabies is present due to animal being a leashed pet and lack of true puncture wound or bite.  If you notice any redness at the site, tenderness or drainage, give me a call.  We have updated your tetanus shot today.

## 2015-10-10 ENCOUNTER — Other Ambulatory Visit: Payer: Self-pay

## 2015-10-10 ENCOUNTER — Encounter: Payer: Self-pay | Admitting: Gynecology

## 2015-10-10 ENCOUNTER — Ambulatory Visit (INDEPENDENT_AMBULATORY_CARE_PROVIDER_SITE_OTHER): Payer: 59 | Admitting: Gynecology

## 2015-10-10 VITALS — BP 116/78 | Ht 68.5 in | Wt 149.0 lb

## 2015-10-10 DIAGNOSIS — Z01419 Encounter for gynecological examination (general) (routine) without abnormal findings: Secondary | ICD-10-CM | POA: Diagnosis not present

## 2015-10-10 DIAGNOSIS — Z1231 Encounter for screening mammogram for malignant neoplasm of breast: Secondary | ICD-10-CM

## 2015-10-10 NOTE — Progress Notes (Signed)
Shari George [birth year] [id number]   History:    45 y.o.  for annual gyn exam with no complaints today. Patient was seen last in the office on April 4 her final postop visit. On April 15 patient underwent laparoscopic right salpingo-oophorectomy and left salpingectomy as a result of a right ovarian cyst. Pathology reported demonstrated the following:  Diagnosis 1. Ovary and fallopian tube, right - BENIGN OVARIAN FIBROMA, BENIGN CALCIFIED NODULE AND CORPUS LUTEUM. - BENIGN FALLOPIAN TUBE WITH BENIGN PARATUBAL CYST. - NO ENDOMETRIOSIS OR EVIDENCE OF MALIGNANCY. 2. Fallopian tube, left - BENIGN FALLOPIAN TUBE WITH BENIGN SURFACE ADENOFIBROMA. - NO ENDOMETRIOSIS OR EVIDENCE OF MALIGNANCY.  Patient is reporting normal menstrual cycles and is using condoms for contraception. Patient reports no past history of any abnormal Pap smear.  Past medical history,surgical history, family history and social history were all reviewed and documented in the EPIC chart.  Gynecologic History Patient's last menstrual period was 10/05/2015. Contraception: condoms Last Pap: 2015. Results were: normal Last mammogram: 2016. Results were: Three-dimensional normal secondary to dense breasts  Obstetric History OB History  Gravida Para Term Preterm AB SAB TAB Ectopic Multiple Living  2 2        2     # Outcome Date GA Lbr Len/2nd Weight Sex Delivery Anes PTL Lv  2 Para           1 Para                ROS: A ROS was performed and pertinent positives and negatives are included in the history.  GENERAL: No fevers or chills. HEENT: No change in vision, no earache, sore throat or sinus congestion. NECK: No pain or stiffness. CARDIOVASCULAR: No chest pain or pressure. No palpitations. PULMONARY: No shortness of breath, cough or wheeze. GASTROINTESTINAL: No abdominal pain, nausea, vomiting or diarrhea, melena or bright red blood per rectum. GENITOURINARY: No urinary frequency, urgency, hesitancy or dysuria.  MUSCULOSKELETAL: No joint or muscle pain, no back pain, no recent trauma. DERMATOLOGIC: No rash, no itching, no lesions. ENDOCRINE: No polyuria, polydipsia, no heat or cold intolerance. No recent change in weight. HEMATOLOGICAL: No anemia or easy bruising or bleeding. NEUROLOGIC: No headache, seizures, numbness, tingling or weakness. PSYCHIATRIC: No depression, no loss of interest in normal activity or change in sleep pattern.     Exam: chaperone present  BP 116/78 mmHg  Ht 5' 8.5" (1.74 m)  Wt 149 lb (67.586 kg)  BMI 22.32 kg/m2  LMP 10/05/2015  Body mass index is 22.32 kg/(m^2).  General appearance : Well developed well nourished female. No acute distress HEENT: Eyes: no retinal hemorrhage or exudates,  Neck supple, trachea midline, no carotid bruits, no thyroidmegaly Lungs: Clear to auscultation, no rhonchi or wheezes, or rib retractions  Heart: Regular rate and rhythm, no murmurs or gallops Breast:Examined in sitting and supine position were symmetrical in appearance, no palpable masses or tenderness,  no skin retraction, no nipple inversion, no nipple discharge, no skin discoloration, no axillary or supraclavicular lymphadenopathy Abdomen: no palpable masses or tenderness, no rebound or guarding Extremities: no edema or skin discoloration or tenderness  Pelvic:  Bartholin, Urethra, Skene Glands: Within normal limits             Vagina: No gross lesions or discharge  Cervix: No gross lesions or discharge  Uterus  anteverted, normal size, shape and consistency, non-tender and mobile  Adnexa  Without masses or tenderness  Anus and perineum  normal   Rectovaginal  normal  sphincter tone without palpated masses or tenderness             Hemoccult not indicated     Assessment/Plan:  45 y.o. female for annual exam will return back to the office later next week for the following screening fasting blood work: Comprehensive metabolic panel, fasting lipid profile, TSH, CBC, and urinalysis.  Pap smear not indicated this year according to the new guidelines. Patient to schedule her 3 3-D mammogram next month. We discussed importance of monthly breast exam. We discussed importance of calcium vitamin D and weightbearing exercises for osteoporosis prevention.   Terrance Mass MD, 11:16 AM 10/10/2015

## 2015-10-14 ENCOUNTER — Other Ambulatory Visit: Payer: 59

## 2015-10-14 DIAGNOSIS — Z01419 Encounter for gynecological examination (general) (routine) without abnormal findings: Secondary | ICD-10-CM

## 2015-10-14 LAB — URINALYSIS W MICROSCOPIC + REFLEX CULTURE
Bacteria, UA: NONE SEEN [HPF]
Bilirubin Urine: NEGATIVE
Casts: NONE SEEN [LPF]
Crystals: NONE SEEN [HPF]
GLUCOSE, UA: NEGATIVE
Hgb urine dipstick: NEGATIVE
Ketones, ur: NEGATIVE
Nitrite: NEGATIVE
PROTEIN: NEGATIVE
SPECIFIC GRAVITY, URINE: 1.02 (ref 1.001–1.035)
Yeast: NONE SEEN [HPF]
pH: 5 (ref 5.0–8.0)

## 2015-10-14 LAB — CBC WITH DIFFERENTIAL/PLATELET
Basophils Absolute: 0 cells/uL (ref 0–200)
Basophils Relative: 0 %
EOS PCT: 6 %
Eosinophils Absolute: 312 cells/uL (ref 15–500)
HCT: 40.2 % (ref 35.0–45.0)
HEMOGLOBIN: 13.2 g/dL (ref 11.7–15.5)
LYMPHS ABS: 1768 {cells}/uL (ref 850–3900)
LYMPHS PCT: 34 %
MCH: 30.8 pg (ref 27.0–33.0)
MCHC: 32.8 g/dL (ref 32.0–36.0)
MCV: 93.7 fL (ref 80.0–100.0)
MPV: 10.6 fL (ref 7.5–12.5)
Monocytes Absolute: 468 cells/uL (ref 200–950)
Monocytes Relative: 9 %
NEUTROS PCT: 51 %
Neutro Abs: 2652 cells/uL (ref 1500–7800)
Platelets: 253 10*3/uL (ref 140–400)
RBC: 4.29 MIL/uL (ref 3.80–5.10)
RDW: 12.8 % (ref 11.0–15.0)
WBC: 5.2 10*3/uL (ref 3.8–10.8)

## 2015-10-14 LAB — COMPREHENSIVE METABOLIC PANEL
ALT: 10 U/L (ref 6–29)
AST: 19 U/L (ref 10–30)
Albumin: 4.3 g/dL (ref 3.6–5.1)
Alkaline Phosphatase: 24 U/L — ABNORMAL LOW (ref 33–115)
BUN: 18 mg/dL (ref 7–25)
CHLORIDE: 106 mmol/L (ref 98–110)
CO2: 23 mmol/L (ref 20–31)
CREATININE: 1.04 mg/dL (ref 0.50–1.10)
Calcium: 9.1 mg/dL (ref 8.6–10.2)
Glucose, Bld: 83 mg/dL (ref 65–99)
Potassium: 4.6 mmol/L (ref 3.5–5.3)
SODIUM: 140 mmol/L (ref 135–146)
Total Bilirubin: 0.7 mg/dL (ref 0.2–1.2)
Total Protein: 6.9 g/dL (ref 6.1–8.1)

## 2015-10-14 LAB — LIPID PANEL
Cholesterol: 153 mg/dL (ref 125–200)
HDL: 71 mg/dL (ref >=46)
LDL CALC: 73 mg/dL (ref <130)
Total CHOL/HDL Ratio: 2.2 Ratio (ref <=5.0)
Triglycerides: 43 mg/dL (ref <150)
VLDL: 9 mg/dL (ref <30)

## 2015-10-14 LAB — TSH: TSH: 6.27 mIU/L — ABNORMAL HIGH

## 2015-10-15 ENCOUNTER — Other Ambulatory Visit: Payer: Self-pay | Admitting: Gynecology

## 2015-10-15 DIAGNOSIS — R7989 Other specified abnormal findings of blood chemistry: Secondary | ICD-10-CM

## 2015-10-16 LAB — URINE CULTURE

## 2015-10-21 ENCOUNTER — Other Ambulatory Visit: Payer: 59

## 2015-10-21 ENCOUNTER — Other Ambulatory Visit: Payer: Self-pay

## 2015-10-21 DIAGNOSIS — R7989 Other specified abnormal findings of blood chemistry: Secondary | ICD-10-CM

## 2015-10-21 LAB — THYROID PANEL WITH TSH
Free Thyroxine Index: 2.2 (ref 1.4–3.8)
T3 UPTAKE: 35 % (ref 22–35)
T4 TOTAL: 6.3 ug/dL (ref 4.5–12.0)
TSH: 5.34 mIU/L — ABNORMAL HIGH

## 2015-10-22 ENCOUNTER — Other Ambulatory Visit: Payer: Self-pay | Admitting: Gynecology

## 2015-10-22 DIAGNOSIS — R7989 Other specified abnormal findings of blood chemistry: Secondary | ICD-10-CM

## 2015-11-07 ENCOUNTER — Ambulatory Visit: Payer: 59

## 2015-11-08 ENCOUNTER — Ambulatory Visit
Admission: RE | Admit: 2015-11-08 | Discharge: 2015-11-08 | Disposition: A | Discharge status: Home / Self Care | Payer: 59 | Source: Ambulatory Visit

## 2015-11-08 DIAGNOSIS — Z1231 Encounter for screening mammogram for malignant neoplasm of breast: Secondary | ICD-10-CM

## 2015-11-15 ENCOUNTER — Encounter: Payer: Self-pay | Admitting: Medical

## 2015-11-15 ENCOUNTER — Ambulatory Visit (INDEPENDENT_AMBULATORY_CARE_PROVIDER_SITE_OTHER): Payer: 59 | Admitting: Medical

## 2015-11-15 ENCOUNTER — Telehealth: Payer: Self-pay | Admitting: Medical

## 2015-11-15 VITALS — BP 112/68 | HR 52 | Temp 98.1°F | Ht 66.0 in | Wt 152.2 lb

## 2015-11-15 DIAGNOSIS — R7989 Other specified abnormal findings of blood chemistry: Secondary | ICD-10-CM

## 2015-11-15 DIAGNOSIS — J029 Acute pharyngitis, unspecified: Secondary | ICD-10-CM | POA: Diagnosis not present

## 2015-11-15 DIAGNOSIS — R591 Generalized enlarged lymph nodes: Secondary | ICD-10-CM | POA: Diagnosis not present

## 2015-11-15 DIAGNOSIS — R946 Abnormal results of thyroid function studies: Secondary | ICD-10-CM | POA: Diagnosis not present

## 2015-11-15 LAB — CBC WITH DIFFERENTIAL/PLATELET
BASOS PCT: 0 %
Basophils Absolute: 0 cells/uL (ref 0–200)
EOS PCT: 2 %
Eosinophils Absolute: 148 cells/uL (ref 15–500)
HCT: 38.3 % (ref 35.0–45.0)
Hemoglobin: 12.3 g/dL (ref 11.7–15.5)
Lymphocytes Relative: 29 %
Lymphs Abs: 2146 cells/uL (ref 850–3900)
MCH: 30.5 pg (ref 27.0–33.0)
MCHC: 32.1 g/dL (ref 32.0–36.0)
MCV: 95 fL (ref 80.0–100.0)
MONOS PCT: 8 %
MPV: 10.2 fL (ref 7.5–12.5)
Monocytes Absolute: 592 cells/uL (ref 200–950)
NEUTROS ABS: 4514 {cells}/uL (ref 1500–7800)
Neutrophils Relative %: 61 %
PLATELETS: 249 10*3/uL (ref 140–400)
RBC: 4.03 MIL/uL (ref 3.80–5.10)
RDW: 12.4 % (ref 11.0–15.0)
WBC: 7.4 10*3/uL (ref 3.8–10.8)

## 2015-11-15 LAB — POCT RAPID STREP A (OFFICE): RAPID STREP A SCREEN: NEGATIVE

## 2015-11-15 MED ORDER — AZITHROMYCIN 250 MG PO TABS: ORAL_TABLET | ORAL | Status: DC

## 2015-11-15 NOTE — Telephone Encounter (Signed)
Pt has low tsh. Normal t3 and t4 about 3 wks ago. She has no overt hypothyroid symptoms. Some faint discomfort over thyroid area. Gyn following. I thought get labs tsh, t3 and t4 today with faint symptoms directly over area. She wants to wait. Since she is your patient I mentioned would get your opinion.

## 2015-11-15 NOTE — Progress Notes (Signed)
Pre visit review using our clinic review tool, if applicable. No additional management support is needed unless otherwise documented below in the visit note. 

## 2015-11-15 NOTE — Progress Notes (Signed)
Subjective:    Patient ID: Shari George, female    DOB: [birth year], 45 y.o.   MRN: [id number]  HPI   Pt in with sorethroat for more than one week.(then states minimal. Then states more over her thyroid area)   Pt also mentioned she has elevated tsh of about 6. When she explains pain. She states feels in thyroid region.But also points to her submandibular region.  Pt gynecologist did not give any hormones. He thought symptoms were too early.  LMP- Nov 04, 2015.    Review of Systems  Constitutional: Negative for fever, chills, fatigue and unexpected weight change.  HENT: Positive for sore throat.        Faint sore throat. Stated intitially then minimized complaint.  Faint discomfort over thyroid area.  Respiratory: Negative for cough, choking, shortness of breath and wheezing.   Cardiovascular: Negative for chest pain and palpitations.  Gastrointestinal: Negative for abdominal pain.  Skin: Negative for rash.  Neurological: Negative for dizziness, seizures, syncope, weakness and headaches.  Hematological: Positive for adenopathy. Does not bruise/bleed easily.  Psychiatric/Behavioral: Negative for behavioral problems and confusion.    Past Medical History  Diagnosis Date  . History of chicken pox   . Environmental allergies   . UTI (lower urinary tract infection)      Social History   Social History  . Marital Status: Married    Spouse Name: N/A  . Number of Children: N/A  . Years of Education: N/A   Occupational History  . Not on file.   Social History Main Topics  . Smoking status: Never Smoker   . Smokeless tobacco: Not on file  . Alcohol Use: 1.2 oz/week    2 Glasses of wine per week     Comment: MODERATE  . Drug Use: Not on file  . Sexual Activity: Yes    Birth Control/ Protection: None   Other Topics Concern  . Not on file   Social History Narrative    Past Surgical History  Procedure Laterality Date  . Knee surgery Left   . Left  knee    1988    " Bone lying on muscle"   . Laparoscopic unilateral salpingectomy Left 09/04/2014    Procedure: LAPAROSCOPIC UNILATERAL SALPINGECTOMY;  Surgeon: Terrance Mass, MD;  Location: [facility];  Service: Gynecology;  Laterality: Left;  . Laparoscopic salpingo oopherectomy Right 09/04/2014    Procedure: LAPAROSCOPIC SALPINGO OOPHORECTOMY;  Surgeon: Terrance Mass, MD;  Location: [facility];  Service: Gynecology;  Laterality: Right;    Family History  Problem Relation Age of Onset  . Hypertension Father     Living  . Heart Problems Father   . Healthy Mother     Living  . Heart disease Paternal Grandfather   . Arthritis Paternal Aunt   . Healthy Brother     x1  . Healthy Son     x1  . Healthy Daughter     x1    No Known Allergies  Current Outpatient Prescriptions on File Prior to Visit  Medication Sig Dispense Refill  . Calcium-Vitamin D 600-200 MG-UNIT per tablet Take 1 tablet by mouth daily.    Marland Kitchen CRANBERRY PO Take 2 tablets by mouth daily. Reported on 10/10/2015     No current facility-administered medications on file prior to visit.    BP 112/68 mmHg  Pulse 52  Temp(Src) 98.1 F (36.7 C) (Oral)  Ht 5\' 6"  (1.676 m)  Wt 152 lb 3.2 oz (69.037 kg)  BMI 24.58 kg/m2  SpO2 98%  LMP 11/04/2015       Objective:   Physical Exam  General  Mental Status - Alert. General Appearance - Well groomed. Not in acute distress.  Skin Rashes- No Rashes.  HEENT Head- Normal. Ear Auditory Canal - Left- Normal. Right - Normal.Tympanic Membrane- Left- Normal. Right- Normal. Eye Sclera/Conjunctiva- Left- Normal. Right- Normal. Nose & Sinuses Nasal Mucosa- Left-  Boggy and Congested. Right-  Boggy and  Congested.Bilateral maxillary and frontal sinus pressure. Mouth & Throat Lips: Upper Lip- Normal: no dryness, cracking, pallor, cyanosis, or vesicular eruption. Lower Lip-Normal: no dryness, cracking, pallor, cyanosis or vesicular eruption. Buccal Mucosa- Bilateral- No Aphthous  ulcers. Oropharynx- No Discharge or Erythema. Tonsils: Characteristics- Bilateral- faint Erythema and Congestion. Size/Enlargement- Bilateral- No enlargement. Discharge- bilateral-None.  Neck Neck- Supple. No Masses. Left submandibular node swelling. Rt side normal. No thyromegaly.   Chest and Lung Exam Auscultation: Breath Sounds:-Clear even and unlabored.  Cardiovascular Auscultation:Rythm- Regular, rate and rhythm. Murmurs & Other Heart Sounds:Ausculatation of the heart reveal- No Murmurs.  Lymphatic Head & Neck General Head & Neck Lymphatics: Bilateral: Description- Lt neck /submandibular  Localized lymphadenopathy.       Assessment & Plan:  For your faint red pharynx by exam we got rapid strep. The test was negative.But your lymph node is swollen. I would recommend getting cbc today and rx azithromycin.   For your faint thyroid discomfort and elevated tsh I recommended going ahead and getting tsh, t3 and t4 today. Your gyn wanted to wait and get test. I will send message to your pcp to see if he wants to order test sooner.  Follow up in 7-10 days or as needed   Mariel Gaudin, Percell Miller, [employer]

## 2015-11-15 NOTE — Addendum Note (Signed)
Addended by: Caffie Pinto on: 11/15/2015 04:31 PM   Modules accepted: Orders

## 2015-11-15 NOTE — Addendum Note (Signed)
Addended by: Rudene Anda on: 11/15/2015 04:41 PM   Modules accepted: Orders

## 2015-11-15 NOTE — Patient Instructions (Addendum)
For your faint red pharynx by exam we got rapid strep. The test was negative.But your lymph node is swollen. I would recommend getting cbc today and rx azithromycin.   For your faint thyroid discomfort and elevated tsh I recommended going ahead and getting tsh, t3 and t4 today. Your gyn wanted to wait and get test. I will send message to your pcp to see if he wants to order test sooner.  Follow up in 7-10 days or as needed

## 2015-11-16 NOTE — Telephone Encounter (Signed)
I would recommend that if the tenderness is not quickly resolving then we would want an [place] in the area. I agree with repeating her thyroid studies. Let me know if you are going to be calling her or if I need to.

## 2015-11-17 ENCOUNTER — Telehealth: Payer: Self-pay | Admitting: Medical

## 2015-11-17 NOTE — Telephone Encounter (Signed)
I did send Einar Pheasant the message regarding pt. He does agree that pt can go ahead and get the thyroid studies. He also wanted to get [place] of pt thyroid/neck area if she still has pain over thyroid area.

## 2015-11-17 NOTE — Telephone Encounter (Signed)
I sent message to Barnet Pall to call pt.

## 2015-11-19 NOTE — Telephone Encounter (Signed)
Attempted to reach pt and VM is not setup. Will try again later.

## 2015-11-21 ENCOUNTER — Telehealth: Payer: Self-pay

## 2015-11-21 NOTE — Telephone Encounter (Signed)
Pt states that [place] is going [place] the [place] of week and she states that she will call back to set up a lab appointment for her thyroid levels. Pt did not have any further questions.

## 2015-11-21 NOTE — Telephone Encounter (Signed)
Spoke with pt and advised her of the labs results. Pt did not have any further questions.

## 2015-11-22 IMAGING — CR DG ABDOMEN 1V
1 series · 1 of 1 positions shown · non-contrast
Comparison: None.

CLINICAL DATA: Urinary tract infection approximately 3 weeks ago;
now with abnormal urinalysis ; assess for kidney stones; no acute
pain

EXAM:
ABDOMEN - 1 VIEW

[t abdomen supine]
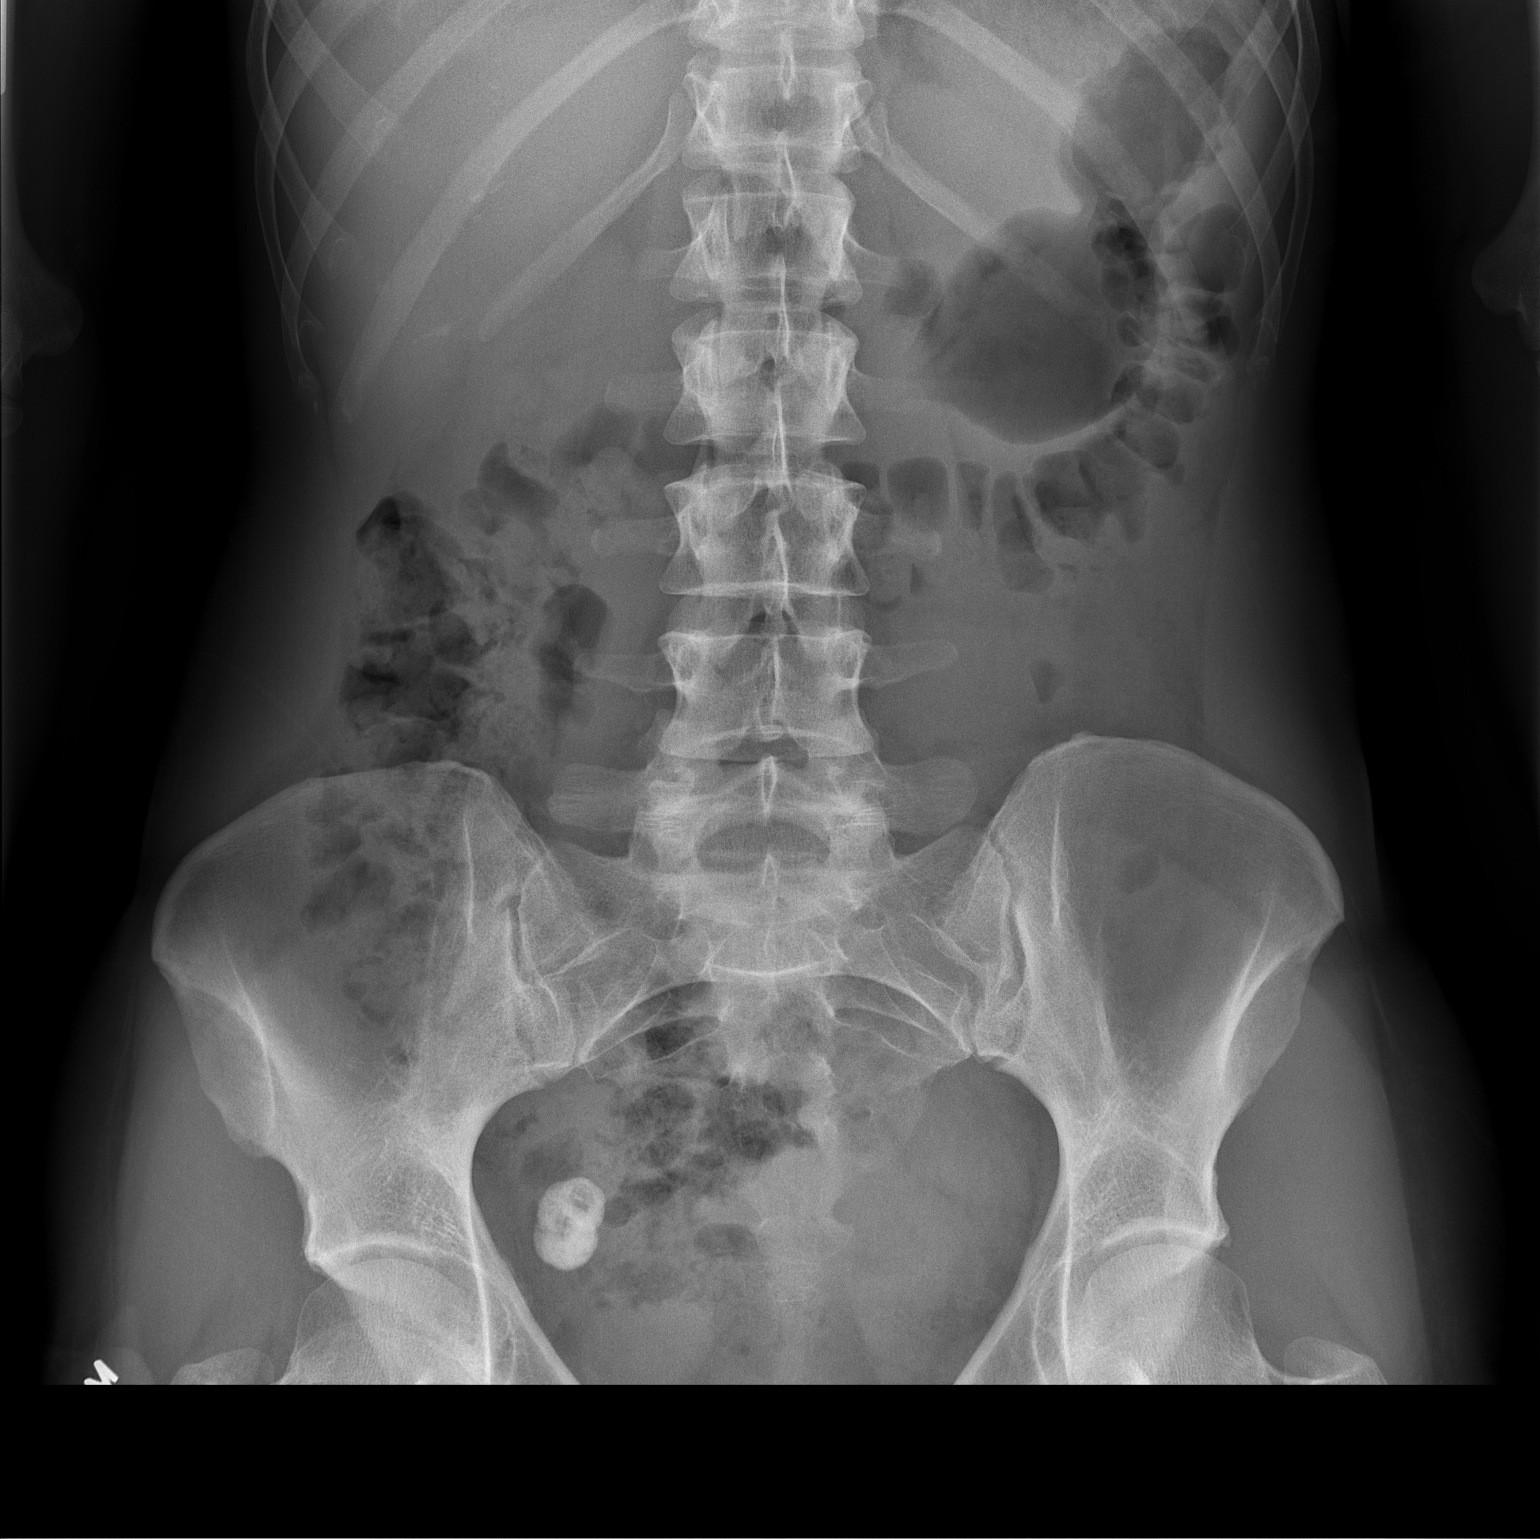

[1 of 1 positions shown; findings below may reference images not displayed]

FINDINGS: The bowel gas pattern is unremarkable. There is a lobulated
calcification in the soft tissues of the true bony pelvis to the
right of midline which measures 1.6 x 2.6 cm. There are no abnormal
calcifications projecting over the kidneys, along the course of the
ureters, or over the urinary bladder. The bony structures were
unremarkable
IMPRESSION: There are no findings suspicious for urinary tract stones. There is
a calcification to the right of midline in the true bony pelvis
which may reflect a uterine fibroid but its etiology is not clear.
Further evaluation with pelvic ultrasound or abdominal and pelvic CT
scanning may be useful.

## 2016-09-30 ENCOUNTER — Other Ambulatory Visit: Payer: Self-pay | Admitting: Gynecology

## 2016-09-30 DIAGNOSIS — Z1231 Encounter for screening mammogram for malignant neoplasm of breast: Secondary | ICD-10-CM

## 2016-10-14 ENCOUNTER — Ambulatory Visit (INDEPENDENT_AMBULATORY_CARE_PROVIDER_SITE_OTHER): Payer: 59 | Admitting: Gynecology

## 2016-10-14 ENCOUNTER — Encounter: Payer: Self-pay | Admitting: Gynecology

## 2016-10-14 VITALS — BP 108/64 | Ht 69.0 in | Wt 150.6 lb

## 2016-10-14 DIAGNOSIS — Z01419 Encounter for gynecological examination (general) (routine) without abnormal findings: Secondary | ICD-10-CM | POA: Diagnosis not present

## 2016-10-14 LAB — LIPID PANEL
CHOL/HDL RATIO: 1.8 ratio (ref <5.0)
CHOLESTEROL: 146 mg/dL (ref <200)
HDL: 79 mg/dL (ref >50)
LDL CALC: 58 mg/dL (ref <100)
TRIGLYCERIDES: 43 mg/dL (ref <150)
VLDL: 9 mg/dL (ref <30)

## 2016-10-14 LAB — COMPREHENSIVE METABOLIC PANEL
ALBUMIN: 4.3 g/dL (ref 3.6–5.1)
ALT: 8 U/L (ref 6–29)
AST: 15 U/L (ref 10–30)
Alkaline Phosphatase: 25 U/L — ABNORMAL LOW (ref 33–115)
BUN: 20 mg/dL (ref 7–25)
CALCIUM: 9.4 mg/dL (ref 8.6–10.2)
CHLORIDE: 107 mmol/L (ref 98–110)
CO2: 18 mmol/L — AB (ref 20–31)
Creat: 1.3 mg/dL — ABNORMAL HIGH (ref 0.50–1.10)
GLUCOSE: 78 mg/dL (ref 65–99)
Potassium: 4.4 mmol/L (ref 3.5–5.3)
Sodium: 142 mmol/L (ref 135–146)
Total Bilirubin: 0.7 mg/dL (ref 0.2–1.2)
Total Protein: 7.2 g/dL (ref 6.1–8.1)

## 2016-10-14 LAB — CBC WITH DIFFERENTIAL/PLATELET
Basophils Absolute: 0 cells/uL (ref 0–200)
Basophils Relative: 0 %
EOS ABS: 245 {cells}/uL (ref 15–500)
Eosinophils Relative: 5 %
HEMATOCRIT: 41 % (ref 35.0–45.0)
HEMOGLOBIN: 13.2 g/dL (ref 11.7–15.5)
LYMPHS ABS: 1470 {cells}/uL (ref 850–3900)
Lymphocytes Relative: 30 %
MCH: 31.1 pg (ref 27.0–33.0)
MCHC: 32.2 g/dL (ref 32.0–36.0)
MCV: 96.5 fL (ref 80.0–100.0)
MONO ABS: 392 {cells}/uL (ref 200–950)
MPV: 10.8 fL (ref 7.5–12.5)
Monocytes Relative: 8 %
NEUTROS ABS: 2793 {cells}/uL (ref 1500–7800)
Neutrophils Relative %: 57 %
Platelets: 253 10*3/uL (ref 140–400)
RBC: 4.25 MIL/uL (ref 3.80–5.10)
RDW: 12.7 % (ref 11.0–15.0)
WBC: 4.9 10*3/uL (ref 3.8–10.8)

## 2016-10-14 LAB — URINALYSIS W MICROSCOPIC + REFLEX CULTURE
BACTERIA UA: NONE SEEN [HPF]
Bilirubin Urine: NEGATIVE
CASTS: NONE SEEN [LPF]
CRYSTALS: NONE SEEN [HPF]
GLUCOSE, UA: NEGATIVE
HGB URINE DIPSTICK: NEGATIVE
Ketones, ur: NEGATIVE
Leukocytes, UA: NEGATIVE
Nitrite: NEGATIVE
PROTEIN: NEGATIVE
RBC / HPF: NONE SEEN RBC/HPF (ref <=2)
Specific Gravity, Urine: 1.02 (ref 1.001–1.035)
WBC, UA: NONE SEEN WBC/HPF (ref <=5)
YEAST: NONE SEEN [HPF]
pH: 5.5 (ref 5.0–8.0)

## 2016-10-14 LAB — TSH: TSH: 4.69 mIU/L — ABNORMAL HIGH

## 2016-10-14 NOTE — Addendum Note (Signed)
Addended by: Dorothyann Gibbs on: 10/14/2016 09:48 AM   Modules accepted: Orders

## 2016-10-14 NOTE — Progress Notes (Signed)
Shari George 1971/02/04 [id number]   History:    46 y.o.  for annual gyn exam with no complaints today. Review of patient's record indicated that in April 2017 patient  underwent laparoscopic right salpingo-oophorectomy and left salpingectomy as a result of a right ovarian cyst. Pathology reported demonstrated the following:  Diagnosis 1. Ovary and fallopian tube, right - BENIGN OVARIAN FIBROMA, BENIGN CALCIFIED NODULE AND CORPUS LUTEUM. - BENIGN FALLOPIAN TUBE WITH BENIGN PARATUBAL CYST. - NO ENDOMETRIOSIS OR EVIDENCE OF MALIGNANCY. 2. Fallopian tube, left - BENIGN FALLOPIAN TUBE WITH BENIGN SURFACE ADENOFIBROMA. - NO ENDOMETRIOSIS OR EVIDENCE OF MALIGNANCY.  Patient is reporting normal menstrual cycles . Patient would no prior history of any abnormal Pap smears. Patient declined flu vaccine this year.   Past medical history,surgical history, family history and social history were all reviewed and documented in the EPIC chart.  Gynecologic History Patient's last menstrual period was 10/11/2016 (exact date). Contraception: condoms Last Pap: 2015. Results were: normal Last mammogram: 2017. Results were: Normal but dense breasts  Obstetric History OB History  Gravida Para Term Preterm AB Living  2 2       2   SAB TAB Ectopic Multiple Live Births               # Outcome Date GA Lbr Len/2nd Weight Sex Delivery Anes PTL Lv  2 Para           1 Para                ROS: A ROS was performed and pertinent positives and negatives are included in the history.  GENERAL: No fevers or chills. HEENT: No change in vision, no earache, sore throat or sinus congestion. NECK: No pain or stiffness. CARDIOVASCULAR: No chest pain or pressure. No palpitations. PULMONARY: No shortness of breath, cough or wheeze. GASTROINTESTINAL: No abdominal pain, nausea, vomiting or diarrhea, melena or bright red blood per rectum. GENITOURINARY: No urinary frequency, urgency, hesitancy or dysuria.  MUSCULOSKELETAL: No joint or muscle pain, no back pain, no recent trauma. DERMATOLOGIC: No rash, no itching, no lesions. ENDOCRINE: No polyuria, polydipsia, no heat or cold intolerance. No recent change in weight. HEMATOLOGICAL: No anemia or easy bruising or bleeding. NEUROLOGIC: No headache, seizures, numbness, tingling or weakness. PSYCHIATRIC: No depression, no loss of interest in normal activity or change in sleep pattern.     Exam: chaperone present  BP 108/64   Ht 5\' 9"  (1.753 m)   Wt 150 lb 9.6 oz (68.3 kg)   LMP 10/11/2016 (Exact Date)   BMI 22.24 kg/m   Body mass index is 22.24 kg/m.  General appearance : Well developed well nourished female. No acute distress HEENT: Eyes: no retinal hemorrhage or exudates,  Neck supple, trachea midline, no carotid bruits, no thyroidmegaly Lungs: Clear to auscultation, no rhonchi or wheezes, or rib retractions  Heart: Regular rate and rhythm, no murmurs or gallops Breast:Examined in sitting and supine position were symmetrical in appearance, no palpable masses or tenderness,  no skin retraction, no nipple inversion, no nipple discharge, no skin discoloration, no axillary or supraclavicular lymphadenopathy Abdomen: no palpable masses or tenderness, no rebound or guarding Extremities: no edema or skin discoloration or tenderness  Pelvic:  Bartholin, Urethra, Skene Glands: Within normal limits             Vagina: No gross lesions or discharge  Cervix: No gross lesions or discharge  Uterus  anteverted, normal size, shape and consistency, non-tender and mobile  Adnexa  Without masses or tenderness  Anus and perineum  normal   Rectovaginal  normal sphincter tone without palpated masses or tenderness             Hemoccult not indicated     Assessment/Plan:  46 y.o. female for annual exam will have the following screening fasting blood work today: Comprehensive metabolic panel, fasting lipid profile, TSH, CBC, and urinalysis. Pap smear done  today. Patient was reminded schedule her mammogram to request three-dimensional mammogram since her breast mammogram last year was described as being dense. Patient declined flu vaccine this year. We discussed importance of calcium vitamin D and weightbearing exercises for osteoporosis prevention.   Terrance Mass MD, 9:36 AM 10/14/2016

## 2016-10-15 ENCOUNTER — Other Ambulatory Visit: Payer: Self-pay | Admitting: Gynecology

## 2016-10-15 DIAGNOSIS — R7989 Other specified abnormal findings of blood chemistry: Secondary | ICD-10-CM

## 2016-10-16 LAB — PAP IG W/ RFLX HPV ASCU

## 2016-10-19 ENCOUNTER — Other Ambulatory Visit: Payer: 59

## 2016-10-19 DIAGNOSIS — R7989 Other specified abnormal findings of blood chemistry: Secondary | ICD-10-CM

## 2016-10-19 LAB — THYROID PANEL WITH TSH
Free Thyroxine Index: 2.5 (ref 1.4–3.8)
T3 Uptake: 38 % — ABNORMAL HIGH (ref 22–35)
T4, Total: 6.6 ug/dL (ref 4.5–12.0)
TSH: 4.78 mIU/L — ABNORMAL HIGH

## 2016-10-19 LAB — CREATININE, SERUM: Creat: 1.24 mg/dL — ABNORMAL HIGH (ref 0.50–1.10)

## 2016-10-20 ENCOUNTER — Other Ambulatory Visit: Payer: Self-pay | Admitting: Gynecology

## 2016-10-20 DIAGNOSIS — R7989 Other specified abnormal findings of blood chemistry: Secondary | ICD-10-CM

## 2016-10-21 ENCOUNTER — Telehealth: Payer: Self-pay | Admitting: *Deleted

## 2016-10-21 ENCOUNTER — Ambulatory Visit (INDEPENDENT_AMBULATORY_CARE_PROVIDER_SITE_OTHER): Payer: 59 | Admitting: Gynecology

## 2016-10-21 ENCOUNTER — Encounter: Payer: Self-pay | Admitting: Gynecology

## 2016-10-21 VITALS — BP 106/72 | Wt 150.8 lb

## 2016-10-21 DIAGNOSIS — E079 Disorder of thyroid, unspecified: Secondary | ICD-10-CM | POA: Diagnosis not present

## 2016-10-21 DIAGNOSIS — R7989 Other specified abnormal findings of blood chemistry: Secondary | ICD-10-CM

## 2016-10-21 LAB — CREATININE, SERUM: Creat: 1.2 mg/dL — ABNORMAL HIGH (ref 0.50–1.10)

## 2016-10-21 NOTE — Telephone Encounter (Signed)
Appointment on 01/06/17 @ 8:15am

## 2016-10-21 NOTE — Patient Instructions (Signed)
Serum Creatinine Test Why am I having this test? A creatinine test is performed to measure the amount of creatinine in your blood (serum). Creatinine is a waste product of normal muscle activity (contraction). The kidneys filter creatinine from your blood and remove it from your body through urination. Blood creatinine levels stay consistent in people whose muscle mass stays consistent. This level can be increased in people who perform resistance exercise to increase muscle mass. Because creatinine is removed from your body by the kidneys, this test is often used as a way to measure kidney function. Your health care provider may recommend this test if he or she suspects that you have a condition that is negatively affecting your kidney function. This test may also be done as a part of routine blood work used to assess your overall health. What kind of sample is taken? A blood sample is required for this test. It is usually collected by inserting a needle into a vein or by sticking a finger with a small needle. For children, the blood sample is usually collected by sticking the child's heel with a small needle. How do I prepare for this test? There is no preparation or fasting required for this test. What are the reference ranges? Reference ranges are considered healthy ranges established after testing a large group of healthy people. Reference ranges may vary among different people, labs, and hospitals. It is your responsibility to obtain your test results. Ask the lab or department performing the test when and how you will get your results.  Children or adolescents:  Less than 63 years old: 0.1-0.4 mg/dL.  35-71 years old: 0.2-0.5 mg/dL.  60-53 years old: 0.3-0.6 mg/dL.  70-33 years old: 0.4-1 mg/dL.  Adult female:  67-4 years old: 0.5-1 mg/dL.  59-83 years old: 0.5-1.1 mg/dL.  61 years and older: 0.5-1.2 mg/dL.  Adult female:  97-37 years old: 0.6-1.2 mg/dL.  35-61 years old: 0.6-1.3  mg/dL.  61 years and older: 0.7-1.3 mg/dL. The reference range may be higher in people who perform resistance exercise to increase muscle mass. What do the results mean? Abnormally high levels of serum creatinine can be caused by many health conditions. These may include:  Kidney disease.  Urinary tract obstruction.  Lower-than-normal blood flow to the kidneys.  Rhabdomyolysis. This occurs when muscle damage causes the release of molecules into the bloodstream, which results in kidney damage.  Acromegaly. This is a condition that causes enlarged bones.  Gigantism. Abnormally low levels of serum creatinine can also be caused by many health conditions. These may include:  Conditions that cause you to be inactive throughout much of the day.  Decreased muscle mass. Talk with your health care provider to discuss your results, treatment options, and if necessary, the need for more tests. Talk with your health care provider if you have any questions about your results. Talk with your health care provider to discuss your results, treatment options, and if necessary, the need for more tests. Talk with your health care provider if you have any questions about your results. This information is not intended to replace advice given to you by your health care provider. Make sure you discuss any questions you have with your health care provider. Document Released: 07/01/2004 Document Revised: 02/01/2016 Document Reviewed: 11/02/2013 Elsevier Interactive Patient Education  2017 [place]. Hypothyroidism Hypothyroidism is a disorder of the thyroid. The thyroid is a large gland that is located in the lower front of the neck. The thyroid releases hormones that  control how the body works. With hypothyroidism, the thyroid does not make enough of these hormones. What are the causes? Causes of hypothyroidism may include:  Viral infections.  Pregnancy.  Your own defense system (immune system)  attacking your thyroid.  Certain medicines.  Birth defects.  Past radiation treatments to your head or neck.  Past treatment with radioactive iodine.  Past surgical removal of part or all of your thyroid.  Problems with the gland that is located in the center of your brain (pituitary). What are the signs or symptoms? Signs and symptoms of hypothyroidism may include:  Feeling as though you have no energy (lethargy).  Inability to tolerate cold.  Weight gain that is not explained by a change in diet or exercise habits.  Dry skin.  Coarse hair.  Menstrual irregularity.  Slowing of thought processes.  Constipation.  Sadness or depression. How is this diagnosed? Your health care provider may diagnose hypothyroidism with blood tests and ultrasound tests. How is this treated? Hypothyroidism is treated with medicine that replaces the hormones that your body does not make. After you begin treatment, it may take several weeks for symptoms to go away. Follow these instructions at home:  Take medicines only as directed by your health care provider.  If you start taking any new medicines, tell your health care provider.  Keep all follow-up visits as directed by your health care provider. This is important. As your condition improves, your dosage needs may change. You will need to have blood tests regularly so that your health care provider can watch your condition. Contact a health care provider if:  Your symptoms do not get better with treatment.  You are taking thyroid replacement medicine and:  You sweat excessively.  You have tremors.  You feel anxious.  You lose weight rapidly.  You cannot tolerate heat.  You have emotional swings.  You have diarrhea.  You feel weak. Get help right away if:  You develop chest pain.  You develop an irregular heartbeat.  You develop a rapid heartbeat. This information is not intended to replace advice given to you by your  health care provider. Make sure you discuss any questions you have with your health care provider. Document Released: 06/08/2005 Document Revised: 11/14/2015 Document Reviewed: 10/24/2013 Elsevier Interactive Patient Education  2017 [employer].

## 2016-10-21 NOTE — Progress Notes (Signed)
   Patient is a 46 year old that was seen the office for her annual exam on 10/14/2016 and is here to discuss her thyroid function tests and serum creatinine which were abnormal. Patient is otherwise asymptomatic.  Patient's recent thyroid function tests as compared to 2017 as follows: Results for [name], [name] (MRN [id number]) as of 10/21/2016 09:33  Ref. Range 10/14/2015 09:20 10/21/2015 09:10 11/15/2015 16:31 10/14/2016 09:38 10/19/2016 09:17  TSH Latest Units: mIU/L 6.27 (H) 5.34 (H)  4.69 (H) 4.78 (H)  Thyroxine (T4) Latest Ref Range: 4.5 - 12.0 ug/dL  6.3   6.6  Free Thyroxine Index Latest Ref Range: 1.4 - 3.8   2.2   2.5   Her T3 uptake on 10/19/2016 was found to be elevated at 38.  Her serum creatinine level in 2017 was normal and this year as follows: Results for [name], [name] (MRN [id number]) as of 10/21/2016 09:33  Ref. Range 10/14/2016 09:38 10/19/2016 09:17  Creatinine Latest Ref Range: 0.50 - 1.10 mg/dL 1.30 (H) 1.24 (H)    Patient will have her serum creatinine level repeated today. She had been encouraged to increase her fluid intake. I'm going to refer her to the medical endocrinologist Dr. Renne Crigler for further evaluation treatment of her thyroid dysfunction and follow-up on her renal function.  Greater than 90% of time was spent counseling correlating care for this patient. Time of consultation 10 minutes

## 2016-10-21 NOTE — Telephone Encounter (Signed)
-----   Message from Terrance Mass, MD sent at 10/21/2016  9:42 AM EDT ----- Anderson Malta, please make an appointment for this patient with Dr.Gherge for this patient with thyroid dysfunction a slightly elevated serum creatinine.

## 2016-10-21 NOTE — Telephone Encounter (Signed)
Referral placed they will contact pt to schedule. 

## 2016-11-04 ENCOUNTER — Encounter: Payer: Self-pay | Admitting: Gynecology

## 2016-11-10 ENCOUNTER — Ambulatory Visit
Admission: RE | Admit: 2016-11-10 | Discharge: 2016-11-10 | Disposition: A | Discharge status: Home / Self Care | Payer: 59 | Source: Ambulatory Visit | Attending: Gynecology | Admitting: Gynecology

## 2016-11-10 DIAGNOSIS — Z1231 Encounter for screening mammogram for malignant neoplasm of breast: Secondary | ICD-10-CM

## 2017-01-06 ENCOUNTER — Ambulatory Visit (INDEPENDENT_AMBULATORY_CARE_PROVIDER_SITE_OTHER): Payer: 59 | Admitting: Internal Medicine

## 2017-01-06 ENCOUNTER — Encounter: Payer: Self-pay | Admitting: Internal Medicine

## 2017-01-06 VITALS — BP 118/62 | HR 55 | Ht 68.0 in | Wt 153.0 lb

## 2017-01-06 DIAGNOSIS — R7989 Other specified abnormal findings of blood chemistry: Secondary | ICD-10-CM

## 2017-01-06 DIAGNOSIS — R946 Abnormal results of thyroid function studies: Secondary | ICD-10-CM

## 2017-01-06 DIAGNOSIS — E063 Autoimmune thyroiditis: Secondary | ICD-10-CM | POA: Insufficient documentation

## 2017-01-06 DIAGNOSIS — E038 Other specified hypothyroidism: Secondary | ICD-10-CM | POA: Insufficient documentation

## 2017-01-06 LAB — T4, FREE: Free T4: 0.76 ng/dL (ref 0.60–1.60)

## 2017-01-06 LAB — T3, FREE: T3, Free: 2.6 pg/mL (ref 2.3–4.2)

## 2017-01-06 LAB — TSH: TSH: 4 u[IU]/mL (ref 0.35–4.50)

## 2017-01-06 NOTE — Progress Notes (Signed)
Patient ID: Shari George, female   DOB: 04/19/1971, 46 y.o.   MRN: [id number]    HPI  Shari George is a 46 y.o.-year-old female, referred by Dr Uvaldo Rising, for management of hypothyroidism. She also has a higher Creatinine >> will be addressed by PCP. She moved from [place] ([place] in 2015.  Pt. has been found to have a high TSH in 2017 >> remained mildly elevated since then.  She is not on thyroid hormones.   I reviewed pt's thyroid tests: Lab Results  Component Value Date   TSH 4.78 (H) 10/19/2016   TSH 4.69 (H) 10/14/2016   TSH 5.34 (H) 10/21/2015   TSH 6.27 (H) 10/14/2015   TSH 4.189 11/08/2013    Complete panels: Component     Latest Ref Rng & Units 10/21/2015 10/19/2016  Thyroxine (T4)     4.5 - 12.0 ug/dL 6.3 6.6  T3 Uptake     22 - 35 % 35 38 (H)  Free Thyroxine Index     1.4 - 3.8 2.2 2.5  TSH     mIU/L 5.34 (H) 4.78 (H)   Pt describes: - + weight gain (3-4 lbs in last 1 year) - + slight fatigue (worse) - no cold intolerance, + heat intolerance at night - no depression or anxiety - no constipation, + diarrhea - no dry skin - + hair loss (more frequent now)  Pt denies feeling nodules in neck, hoarseness, dysphagia/odynophagia, SOB with lying down.  She has no FH of thyroid disorders.. No FH of thyroid cancer.  No h/o radiation tx to head or neck. No recent use of iodine supplements.  She has a higher creatinine: Lab Results  Component Value Date   CREATININE 1.20 (H) 10/21/2016   CREATININE 1.24 (H) 10/19/2016   CREATININE 1.30 (H) 10/14/2016   CREATININE 1.04 10/14/2015   CREATININE 1.1 07/03/2014   CREATININE 1.07 11/08/2013   She is exercising consistently, but not excessively. No creatine supplements. No DM, HTN.  ROS: Constitutional: + see HPI, + poor sleep Eyes: no blurry vision, no xerophthalmia ENT: no sore throat, no nodules palpated in throat, no dysphagia/odynophagia, no hoarseness, + tinnitus Cardiovascular: no  CP/SOB/palpitations/leg swelling Respiratory: no cough/SOB Gastrointestinal: no N/V/+ D/no C/+ acid reflux Musculoskeletal: no muscle/joint aches Skin: no rashes, + hair loss Neurological: no tremors/numbness/tingling/dizziness Psychiatric: no depression/anxiety  Past Medical History:  Diagnosis Date  . Environmental allergies   . History of chicken pox   . UTI (lower urinary tract infection)    Past Surgical History:  Procedure Laterality Date  . KNEE SURGERY Left   . LAPAROSCOPIC SALPINGO OOPHERECTOMY Right 09/04/2014   Procedure: LAPAROSCOPIC SALPINGO OOPHORECTOMY;  Surgeon: Terrance Mass, MD;  Location: [facility];  Service: Gynecology;  Laterality: Right;  . LAPAROSCOPIC UNILATERAL SALPINGECTOMY Left 09/04/2014   Procedure: LAPAROSCOPIC UNILATERAL SALPINGECTOMY;  Surgeon: Terrance Mass, MD;  Location: [facility];  Service: Gynecology;  Laterality: Left;  . left  knee   1988   " Bone lying on muscle"    Social History   Social History  . Marital status: Married    Spouse name: N/A  . Number of children: 2   Occupational History  . Stay at home mom   Social History Main Topics  . Smoking status: Never Smoker  . Smokeless tobacco: Never Used  . Alcohol use 1.2 oz/week    2 Glasses of wine per week     Comment: MODERATE  . Drug use: No  . Sexual  activity: Yes   Current Outpatient Prescriptions on File Prior to Visit  Medication Sig Dispense Refill  . Calcium-Vitamin D 600-200 MG-UNIT per tablet Take 1 tablet by mouth daily.    Marland Kitchen CRANBERRY PO Take 2 tablets by mouth daily. Reported on 10/10/2015     No current facility-administered medications on file prior to visit.    No Known Allergies   Family History  Problem Relation Age of Onset  . Hypertension Father        Living  . Heart Problems Father   . Healthy Mother        Living  . Heart disease Paternal Grandfather   . Arthritis Paternal Aunt   . Healthy Brother        x1  . Healthy Son        x1  .  Healthy Daughter        x1   PE: BP 118/62 (BP Location: Left Arm, Patient Position: Sitting)   Pulse (!) 55   Ht 5\' 8"  (1.727 m)   Wt 153 lb (69.4 kg)   LMP 12/22/2016   SpO2 98%   BMI 23.26 kg/m  Wt Readings from Last 3 Encounters:  01/06/17 153 lb (69.4 kg)  10/21/16 150 lb 12.8 oz (68.4 kg)  10/14/16 150 lb 9.6 oz (68.3 kg)   Constitutional: normal weight, in NAD Eyes: PERRLA, EOMI, no exophthalmos ENT: moist mucous membranes, no thyromegaly, no cervical lymphadenopathy Cardiovascular: RRR, No MRG Respiratory: CTA B Gastrointestinal: abdomen soft, NT, ND, BS+ Musculoskeletal: no deformities, strength intact in all 4 Skin: moist, warm, no rashes Neurological: no tremor with outstretched hands, DTR normal in all 4  ASSESSMENT: 1. Elevated TSH  PLAN:  1. Patient with at least 1.5 years of an elevated TSH, with some possible mild hypothyroid sxs: few lbs weight gain, some fatigue, hair loss, but not bothersome - she appears euthyroid.  - she does not appear to have a goiter, thyroid nodules, or neck compression symptoms - we discussed about the possibility of Hashimoto thyroiditi. I explained that this is an autoimmune disorder, in which she develops antibodies against her own thyroid. The antibodies bind to the thyroid tissue and cause inflammation, and, eventually, destruction of the gland and hypothyroidism. We don't know how long this process can be, it can last from months to years.  I will also add thyroid antibody levels to TFTs today to screen for this condition. - We discussed about treatment for Hashimoto thyroiditis, which is actually limited to thyroid hormones in case her TFTs are abnormal.  - We discussed about subclinical hypothyroidism in general and also about indications for treatment:  Desire for pregnancy  TSH higher than 10  TSH lower than 10 with signs or symptoms consistent with hypothyroidism - We discussed about correct intake of levothyroxine, in  case we start this, fasting, with water, separated by at least 30 minutes from breakfast, and separated by more than 4 hours from calcium, iron, multivitamins, acid reflux medications (PPIs). - will check thyroid tests today: Orders Placed This Encounter  Procedures  . TSH  . T4, free  . T3, free  . Thyroid peroxidase antibody  . Thyroglobulin antibody  - If labs today are abnormal, she will need to return in ~6 weeks for repeat labs - Otherwise, I will see her back in 6 months  Component     Latest Ref Rng & Units 01/06/2017  TSH     0.35 - 4.50 uIU/mL 4.00  T4,Free(Direct)  0.60 - 1.60 ng/dL 0.76  Triiodothyronine,Free,Serum     2.3 - 4.2 pg/mL 2.6  Thyroperoxidase Ab SerPl-aCnc     <9 IU/mL <1  Thyroglobulin Ab     <2 IU/mL 8 (H)   TFTs now normal >> no LT4 needed for now. TPO Abs normal, but slightly elevated ATA Abs >> new Dx of Hashimoto's thyroiditis. Will recheck the thyroid tests at next visit.  Philemon Kingdom, MD PhD [facility] Endocrinology

## 2017-01-06 NOTE — Patient Instructions (Signed)
Please stop at the lab.  I will let you know about the labs through [place].  If we start Levothyroxine, take his every day, with water, at least 30 minutes before breakfast, separated by at least 4 hours from: - acid reflux medications - calcium - iron - multivitamins  Please establish care with a PCP at [facility].  Please come back for a follow-up appointment in 6 months.

## 2017-01-07 LAB — THYROID PEROXIDASE ANTIBODY: Thyroperoxidase Ab SerPl-aCnc: <1 IU/mL (ref <9)

## 2017-01-07 LAB — THYROGLOBULIN ANTIBODY: Thyroglobulin Ab: 8 IU/mL — ABNORMAL HIGH (ref <2)

## 2017-02-25 ENCOUNTER — Ambulatory Visit: Payer: 59 | Admitting: Medical

## 2017-03-01 ENCOUNTER — Ambulatory Visit (INDEPENDENT_AMBULATORY_CARE_PROVIDER_SITE_OTHER): Payer: 59 | Admitting: Medical

## 2017-03-01 VITALS — BP 110/61 | HR 50 | Temp 97.9°F | Resp 16 | Ht 68.0 in | Wt 153.0 lb

## 2017-03-01 DIAGNOSIS — R946 Abnormal results of thyroid function studies: Secondary | ICD-10-CM

## 2017-03-01 DIAGNOSIS — R7989 Other specified abnormal findings of blood chemistry: Secondary | ICD-10-CM | POA: Diagnosis not present

## 2017-03-01 NOTE — Patient Instructions (Addendum)
For your history of elevated TSH, continue to follow up with endocrinologist. I will review the notes patient over to me.  For your mild elevated serum creatinine in the past, I will order repeat of studies but include GFR as well. Your creatinine is only minimally elevated. I want you to hydrate well today before in the day of repeat testing. We'll let you know these results when they come in.  Follow-up date to be determined after lab review.  Also want to remind you that flu vaccine is recommended and you could get that done in October. All you would need to do is call and ask for a nurse flu vaccine visit and he could have that done.

## 2017-03-01 NOTE — Progress Notes (Signed)
Subjective:    Patient ID: Shari George, female    DOB: [birth year], 46 y.o.   MRN: [id number]  HPI  Pt in for follow up.  Pt does exercise she jogs, play tennis and some weights.  Pt did get results from her thyroid studies. She is aware of the study results. Will repeat test in 6 months. No thyroid meds presently.  Pt has hx of mild creatine increased on past 2 test. No htn. No family hx of kidney disease. Pt is not diabetic. BP is good today.  Pt lipid panel 4 months ago was normal.     Review of Systems  Constitutional: Negative for chills, diaphoresis, fatigue and fever.  HENT: Negative for congestion, drooling, ear pain, facial swelling, nosebleeds, postnasal drip, rhinorrhea, sinus pain and sinus pressure.   Respiratory: Negative for cough, chest tightness, shortness of breath and wheezing.   Cardiovascular: Negative for chest pain and palpitations.  Gastrointestinal: Negative for abdominal pain.  Musculoskeletal: Negative for back pain.  Skin: Negative for pallor.  Hematological: Negative for adenopathy. Does not bruise/bleed easily.  Psychiatric/Behavioral: Negative for behavioral problems, confusion, dysphoric mood and hallucinations.   Past Medical History:  Diagnosis Date  . Environmental allergies   . History of chicken pox   . UTI (lower urinary tract infection)      Social History   Social History  . Marital status: Married    Spouse name: N/A  . Number of children: N/A  . Years of education: N/A   Occupational History  . Not on file.   Social History Main Topics  . Smoking status: Never Smoker  . Smokeless tobacco: Never Used  . Alcohol use 1.2 oz/week    2 Glasses of wine per week     Comment: MODERATE  . Drug use: No  . Sexual activity: Yes    Birth control/ protection: Condom   Other Topics Concern  . Not on file   Social History Narrative  . No narrative on file    Past Surgical History:  Procedure Laterality Date  . KNEE  SURGERY Left   . LAPAROSCOPIC SALPINGO OOPHERECTOMY Right 09/04/2014   Procedure: LAPAROSCOPIC SALPINGO OOPHORECTOMY;  Surgeon: Terrance Mass, MD;  Location: [facility];  Service: Gynecology;  Laterality: Right;  . LAPAROSCOPIC UNILATERAL SALPINGECTOMY Left 09/04/2014   Procedure: LAPAROSCOPIC UNILATERAL SALPINGECTOMY;  Surgeon: Terrance Mass, MD;  Location: [facility];  Service: Gynecology;  Laterality: Left;  . left  knee   1988   " Bone lying on muscle"     Family History  Problem Relation Age of Onset  . Hypertension Father        Living  . Heart Problems Father   . Healthy Mother        Living  . Heart disease Paternal Grandfather   . Arthritis Paternal Aunt   . Healthy Brother        x1  . Healthy Son        x1  . Healthy Daughter        x1    No Known Allergies  Current Outpatient Prescriptions on File Prior to Visit  Medication Sig Dispense Refill  . Calcium-Vitamin D 600-200 MG-UNIT per tablet Take 1 tablet by mouth daily.    Marland Kitchen CRANBERRY PO Take 2 tablets by mouth daily. Reported on 10/10/2015     No current facility-administered medications on file prior to visit.     BP 110/61   Pulse (!) 50  Temp 97.9 F (36.6 C) (Oral)   Resp 16   Ht 5\' 8"  (1.727 m)   Wt 153 lb (69.4 kg)   SpO2 100%   BMI 23.26 kg/m       Objective:   Physical Exam  General Mental Status- Alert. General Appearance- Not in acute distress.   Skin General: Color- Normal Color. Moisture- Normal Moisture.  Neck Carotid Arteries- Normal color. Moisture- Normal Moisture. No carotid bruits. No JVD.  Chest and Lung Exam Auscultation: Breath Sounds:-Normal.  Cardiovascular Auscultation:Rythm- Regular. Murmurs & Other Heart Sounds:Auscultation of the heart reveals- No Murmurs.  Abdomen Inspection:-Inspeection Normal. Palpation/Percussion:Note:No mass. Palpation and Percussion of the abdomen reveal- Non Tender, Non Distended + BS, no rebound or guarding.    Neurologic Cranial  Nerve exam:- CN III-XII intact(No nystagmus), symmetric smile. Strength:- 5/5 equal and symmetric strength both upper and lower extremities.     Assessment & Plan:  For your history of elevated TSH, continue to follow up with endocrinologist. I will review the notes patient over to me.  For your mild elevated serum creatinine in the past, I will order repeat of studies but include GFR as well. Your creatinine is only minimally elevated. I want you to hydrate well today before in the day of repeat testing. We'll let you know these results when they come in.  Follow-up date to be determined after lab review.  Also want to remind you that flu vaccine is recommended and you could get that done in October. All you would need to do is call and ask for a nurse flu vaccine visit and he could have that done.  Sharvil Hoey, Percell Miller, PA-C

## 2017-03-04 ENCOUNTER — Other Ambulatory Visit (INDEPENDENT_AMBULATORY_CARE_PROVIDER_SITE_OTHER): Payer: 59

## 2017-03-04 DIAGNOSIS — R7989 Other specified abnormal findings of blood chemistry: Secondary | ICD-10-CM

## 2017-03-04 LAB — COMPREHENSIVE METABOLIC PANEL
ALT: 9 U/L (ref 0–35)
AST: 14 U/L (ref 0–37)
Albumin: 4.2 g/dL (ref 3.5–5.2)
Alkaline Phosphatase: 25 U/L — ABNORMAL LOW (ref 39–117)
BILIRUBIN TOTAL: 0.8 mg/dL (ref 0.2–1.2)
BUN: 19 mg/dL (ref 6–23)
CALCIUM: 9.2 mg/dL (ref 8.4–10.5)
CO2: 26 mEq/L (ref 19–32)
Chloride: 106 mEq/L (ref 96–112)
Creatinine, Ser: 1.19 mg/dL (ref 0.40–1.20)
GFR: 52.29 mL/min — ABNORMAL LOW (ref >60.00)
Glucose, Bld: 75 mg/dL (ref 70–99)
POTASSIUM: 4.1 meq/L (ref 3.5–5.1)
SODIUM: 138 meq/L (ref 135–145)
TOTAL PROTEIN: 7 g/dL (ref 6.0–8.3)

## 2017-07-09 ENCOUNTER — Ambulatory Visit: Payer: 59 | Admitting: Internal Medicine

## 2017-07-09 ENCOUNTER — Other Ambulatory Visit (HOSPITAL_COMMUNITY): Admission: RE | Admit: 2017-07-09 | Payer: 59 | Source: Ambulatory Visit

## 2017-07-09 ENCOUNTER — Other Ambulatory Visit (INDEPENDENT_AMBULATORY_CARE_PROVIDER_SITE_OTHER): Payer: 59

## 2017-07-09 ENCOUNTER — Other Ambulatory Visit: Payer: Self-pay | Admitting: Internal Medicine

## 2017-07-09 ENCOUNTER — Encounter: Payer: Self-pay | Admitting: Internal Medicine

## 2017-07-09 VITALS — BP 100/60 | HR 50 | Ht 68.0 in | Wt 151.2 lb

## 2017-07-09 DIAGNOSIS — E063 Autoimmune thyroiditis: Principal | ICD-10-CM

## 2017-07-09 DIAGNOSIS — E038 Other specified hypothyroidism: Secondary | ICD-10-CM

## 2017-07-09 LAB — T3, FREE: T3, Free: 2.9 pg/mL (ref 2.3–4.2)

## 2017-07-09 LAB — TSH: TSH: 4.44 u[IU]/mL (ref 0.35–4.50)

## 2017-07-09 LAB — T4, FREE: Free T4: 0.89 ng/dL (ref 0.60–1.60)

## 2017-07-09 NOTE — Progress Notes (Signed)
Patient ID: Shari George, female   DOB: [birth year], 47 y.o.   MRN: [id number]    HPI  Shari George is a 47 y.o.-year-old female, initially referred by Dr Uvaldo Rising, now returning for follow-up for hypothyroidism, diagnosed as Hashimoto's hypothyroidism at last visit. She also has a higher Creatinine >> will be addressed by PCP. She moved from [place] ([place]) in 2015.  Last visit 6 months ago.  Reviewed history: Pt. has been found to have a high TSH in 2017 >> mildly elevated since then.  She is not on thyroid hormones.  I reviewed pt's thyroid tests -last TSH was at the upper limit of normal, free T4 normal: Lab Results  Component Value Date   TSH 4.00 01/06/2017   TSH 4.78 (H) 10/19/2016   TSH 4.69 (H) 10/14/2016   TSH 5.34 (H) 10/21/2015   TSH 6.27 (H) 10/14/2015   TSH 4.189 11/08/2013   FREET4 0.76 01/06/2017    As last visit, her thyroid antibodies were elevated, pointing towards Hashimoto's thyroiditis: Component     Latest Ref Rng & Units 01/06/2017  Thyroperoxidase Ab SerPl-aCnc     <9 IU/mL <1  Thyroglobulin Ab     <2 IU/mL 8 (H)   Pt denies: - feeling nodules in neck - hoarseness - dysphagia - choking - SOB with lying down  She has no FH of thyroid disorders. No FH of thyroid cancer. No h/o radiation tx to head or neck.  No seaweed or kelp. No recent contrast studies. No herbal supplements. No Biotin use. No recent steroids use.   She usually has a higher creatinine: Lab Results  Component Value Date   CREATININE 1.19 03/04/2017   CREATININE 1.20 (H) 10/21/2016   CREATININE 1.24 (H) 10/19/2016   CREATININE 1.30 (H) 10/14/2016   CREATININE 1.04 10/14/2015   CREATININE 1.1 07/03/2014   CREATININE 1.07 11/08/2013   She is exercising consistently, but not excessively. No creatine supplements. No DM, HTN.  ROS: Constitutional: + weight gain/no weight loss, +  fatigue, no subjective hyperthermia, no subjective hypothermia Eyes: no blurry  vision, no xerophthalmia ENT: no sore throat,  + see HPI, + tinnitus Cardiovascular: no CP/no SOB/no palpitations/no leg swelling Respiratory: no cough/no SOB/no wheezing Gastrointestinal: no N/no V/+ D/no C/no acid reflux Musculoskeletal: no muscle aches/+ joint aches Skin: no rashes, + hair loss Neurological: no tremors/no numbness/no tingling/no dizziness  I reviewed pt's medications, allergies, PMH, social hx, family hx, and changes were documented in the history of present illness. Otherwise, unchanged from my initial visit note.  Past Medical History:  Diagnosis Date  . Environmental allergies   . History of chicken pox   . UTI (lower urinary tract infection)    Past Surgical History:  Procedure Laterality Date  . KNEE SURGERY Left   . LAPAROSCOPIC SALPINGO OOPHERECTOMY Right 09/04/2014   Procedure: LAPAROSCOPIC SALPINGO OOPHORECTOMY;  Surgeon: Terrance Mass, MD;  Location: [facility];  Service: Gynecology;  Laterality: Right;  . LAPAROSCOPIC UNILATERAL SALPINGECTOMY Left 09/04/2014   Procedure: LAPAROSCOPIC UNILATERAL SALPINGECTOMY;  Surgeon: Terrance Mass, MD;  Location: [facility];  Service: Gynecology;  Laterality: Left;  . left  knee   1988   " Bone lying on muscle"    Social History   Social History  . Marital status: Married    Spouse name: N/A  . Number of children: 2   Occupational History  . Stay at home mom   Social History Main Topics  . Smoking status: Never Smoker  .  Smokeless tobacco: Never Used  . Alcohol use 1.2 oz/week    2 Glasses of wine per week     Comment: MODERATE  . Drug use: No  . Sexual activity: Yes   Current Outpatient Medications on File Prior to Visit  Medication Sig Dispense Refill  . Calcium-Vitamin D 600-200 MG-UNIT per tablet Take 1 tablet by mouth daily.     No current facility-administered medications on file prior to visit.    No Known Allergies   Family History  Problem Relation Age of Onset  . Hypertension Father         Living  . Heart Problems Father   . Healthy Mother        Living  . Heart disease Paternal Grandfather   . Arthritis Paternal Aunt   . Healthy Brother        x1  . Healthy Son        x1  . Healthy Daughter        x1   PE: BP 100/60   Pulse (!) 50   Ht 5\' 8"  (1.727 m)   Wt 151 lb 3.2 oz (68.6 kg)   LMP 06/13/2017   SpO2 99%   BMI 22.99 kg/m  Wt Readings from Last 3 Encounters:  07/09/17 151 lb 3.2 oz (68.6 kg)  03/01/17 153 lb (69.4 kg)  01/06/17 153 lb (69.4 kg)   Constitutional: normal weight, in NAD Eyes: PERRLA, EOMI, no exophthalmos ENT: moist mucous membranes, no thyromegaly, no cervical lymphadenopathy Cardiovascular: RRR, No MRG Respiratory: CTA B Gastrointestinal: abdomen soft, NT, ND, BS+ Musculoskeletal: no deformities, strength intact in all 4 Skin: moist, warm, no rashes Neurological: no tremor with outstretched hands, DTR normal in all 4  ASSESSMENT: 1.  Hashimoto's hypothyroidism  PLAN:  1. Patient with at least 1.5 years of an elevated TSH, with possible mild hypothyroid symptoms: Few pounds weight gain, some fatigue, hair loss.  At last visit, we checked her thyroid antibodies and they were positive, giving her a diagnosis of Hashimoto's thyroiditis.  Her TFTs were normal at that time, with a TSH of the upper limit of normal.  We did not start her thyroid hormones then. - She appears euthyroid - No goiter, thyroid nodules, or neck compression symptoms - We again discussed about her new diagnosis of Hashimoto's thyroiditis and I explained that this is an autoimmune disorder in which she develops antibodies against her own thyroid.  The antibodies cause thyroid inflammation leading to hypothyroidism, but this process can last for months to years.  As per last visits TFTs, she did not need thyroid hormone replacement - We again discussed about treatment for Hashimoto's thyroiditis, which is limited to thyroid hormones in case her TFTs return abnormal - We  discussed about subclinical hypothyroidism in general and also about indications for treatment:  Desire for pregnancy  TSH higher than 10  TSH lower than 10 with signs or symptoms consistent with hypothyroidism -I  also advised her that if we need to start levothyroxine, to take the thyroid hormone every day, with water, at least 30 minutes before breakfast, separated by at least 4 hours from: - acid reflux medications - calcium - iron - multivitamins - will check thyroid tests today: Orders Placed This Encounter  Procedures  . TSH  . T4, free  . T3, free  - If labs today are abnormal, she will need to return in ~5-6 weeks - Otherwise, I will see her back in 1 year, but in  6 months for repeat labs  - time spent with the patient: 25 min, of which >50% was spent in obtaining information about her symptoms, reviewing her previous labs, evaluations, and treatments, counseling her about Hashimoto's thyroiditis (also please see the discussed topics above), and developing a plan to further investigate and treat it; she had a number of questions which I addressed.  Appointment on 07/09/2017  Component Date Value Ref Range Status  . T3, Free 07/09/2017 2.9  2.3 - 4.2 pg/mL Final  . Free T4 07/09/2017 0.89  0.60 - 1.60 ng/dL Final   Comment: Specimens from patients who are undergoing biotin therapy and /or ingesting biotin supplements may contain high levels of biotin.  The higher biotin concentration in these specimens interferes with this Free T4 assay.  Specimens that contain high levels  of biotin may cause false high results for this Free T4 assay.  Please interpret results in light of the total clinical presentation of the patient.    Marland Kitchen TSH 07/09/2017 4.44  0.35 - 4.50 uIU/mL Final   Thyroid tests are still normal - no need for therapy at this point.  Philemon Kingdom, MD PhD [facility] Endocrinology

## 2017-07-09 NOTE — Patient Instructions (Addendum)
Please stop at [facility] lab.  If we start Levothyroxine, take the thyroid hormone every day, with water, at least 30 minutes before breakfast, separated by at least 4 hours from: - acid reflux medications - calcium - iron - multivitamins  Please come back in 1 year, but in 6 months for labs.

## 2017-09-30 ENCOUNTER — Other Ambulatory Visit: Payer: Self-pay | Admitting: Gynecology

## 2017-09-30 DIAGNOSIS — Z1231 Encounter for screening mammogram for malignant neoplasm of breast: Secondary | ICD-10-CM

## 2017-10-21 ENCOUNTER — Ambulatory Visit: Payer: 59 | Admitting: Gynecology

## 2017-10-21 ENCOUNTER — Encounter: Payer: Self-pay | Admitting: Gynecology

## 2017-10-21 VITALS — BP 110/60 | Ht 69.0 in | Wt 150.0 lb

## 2017-10-21 DIAGNOSIS — Z01411 Encounter for gynecological examination (general) (routine) with abnormal findings: Secondary | ICD-10-CM | POA: Diagnosis not present

## 2017-10-21 DIAGNOSIS — N926 Irregular menstruation, unspecified: Secondary | ICD-10-CM

## 2017-10-21 DIAGNOSIS — Z1321 Encounter for screening for nutritional disorder: Secondary | ICD-10-CM | POA: Diagnosis not present

## 2017-10-21 DIAGNOSIS — Z13220 Encounter for screening for lipoid disorders: Secondary | ICD-10-CM

## 2017-10-21 LAB — CBC WITH DIFFERENTIAL/PLATELET
Basophils Absolute: 21 cells/uL (ref 0–200)
Basophils Relative: 0.4 %
EOS ABS: 218 {cells}/uL (ref 15–500)
Eosinophils Relative: 4.2 %
HEMATOCRIT: 37.7 % (ref 35.0–45.0)
Hemoglobin: 12.8 g/dL (ref 11.7–15.5)
LYMPHS ABS: 1414 {cells}/uL (ref 850–3900)
MCH: 31.8 pg (ref 27.0–33.0)
MCHC: 34 g/dL (ref 32.0–36.0)
MCV: 93.8 fL (ref 80.0–100.0)
MPV: 10.8 fL (ref 7.5–12.5)
Monocytes Relative: 9.5 %
Neutro Abs: 3052 cells/uL (ref 1500–7800)
Neutrophils Relative %: 58.7 %
Platelets: 232 10*3/uL (ref 140–400)
RBC: 4.02 10*6/uL (ref 3.80–5.10)
RDW: 11.5 % (ref 11.0–15.0)
Total Lymphocyte: 27.2 %
WBC: 5.2 10*3/uL (ref 3.8–10.8)
WBCMIX: 494 {cells}/uL (ref 200–950)

## 2017-10-21 LAB — COMPREHENSIVE METABOLIC PANEL
AG Ratio: 1.8 (calc) (ref 1.0–2.5)
ALBUMIN MSPROF: 4.2 g/dL (ref 3.6–5.1)
ALKALINE PHOSPHATASE (APISO): 27 U/L — AB (ref 33–115)
ALT: 10 U/L (ref 6–29)
AST: 17 U/L (ref 10–30)
BILIRUBIN TOTAL: 0.8 mg/dL (ref 0.2–1.2)
BUN/Creatinine Ratio: 18 (calc) (ref 6–22)
BUN: 20 mg/dL (ref 7–25)
CALCIUM: 9.1 mg/dL (ref 8.6–10.2)
CHLORIDE: 107 mmol/L (ref 98–110)
CO2: 24 mmol/L (ref 20–32)
Creat: 1.12 mg/dL — ABNORMAL HIGH (ref 0.50–1.10)
GLOBULIN: 2.4 g/dL (ref 1.9–3.7)
Glucose, Bld: 83 mg/dL (ref 65–99)
POTASSIUM: 4.2 mmol/L (ref 3.5–5.3)
Sodium: 138 mmol/L (ref 135–146)
Total Protein: 6.6 g/dL (ref 6.1–8.1)

## 2017-10-21 LAB — LIPID PANEL
Cholesterol: 146 mg/dL (ref <200)
HDL: 71 mg/dL (ref >50)
LDL CHOLESTEROL (CALC): 62 mg/dL
NON-HDL CHOLESTEROL (CALC): 75 mg/dL (ref <130)
TRIGLYCERIDES: 46 mg/dL (ref <150)
Total CHOL/HDL Ratio: 2.1 (calc) (ref <5.0)

## 2017-10-21 NOTE — Patient Instructions (Signed)
Follow-up if your menstrual irregularity continues.  Follow-up in 1 year for annual exam.

## 2017-10-21 NOTE — Progress Notes (Signed)
    Shari George Feb 12, 1971 [id number]        46 y.o.  G2P2 for annual gynecologic exam.  Former patient of Dr. Toney Rakes.  Having regular monthly menses until 2 months ago when she her period was heavier and lasted 7 days instead of 4.  States the subsequent menses was late by 1 to 2 weeks and was much lighter.  Being followed for thyroid dysfunction although is taking new medication but having her TSH monitored every 6 months.  No weight changes skin or hair changes.  No galactorrhea.  Status post bilateral salpingectomies in the past  Past medical history,surgical history, problem list, medications, allergies, family history and social history were all reviewed and documented as reviewed in the EPIC chart.  ROS:  Performed with pertinent positives and negatives included in the history, assessment and plan.   Additional significant findings : None   Exam: [name] assistant Vitals:   10/21/17 0801  BP: 110/60  Weight: 150 lb (68 kg)  Height: 5\' 9"  (1.753 m)   Body mass index is 22.15 kg/m.  General appearance:  Normal affect, orientation and appearance. Skin: Grossly normal HEENT: Without gross lesions.  No cervical or supraclavicular adenopathy. Thyroid normal.  Lungs:  Clear without wheezing, rales or rhonchi Cardiac: RR, without RMG Abdominal:  Soft, nontender, without masses, guarding, rebound, organomegaly or hernia Breasts:  Examined lying and sitting without masses, retractions, discharge or axillary adenopathy. Pelvic:  Ext, BUS, Vagina: Normal  Cervix: Normal  Uterus: Anteverted, normal size, shape and contour, midline and mobile nontender   Adnexa: Without masses or tenderness    Anus and perineum: Normal   Rectovaginal: Normal sphincter tone without palpated masses or tenderness.    Assessment/Plan:  47 y.o. 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    1. Mild menstrual irregularity over the last 2 cycles.  Discussed ovulatory irregularity is the most common cause.  Other possibilities to include hormonal dysfunction reviewed.  At this point given that its only occurred over the last 2 cycles we will keep menstrual calendar and assuming her menses regulate then will follow expectantly.  If she continues to have irregular menses she will represent for further evaluation. 2. Pap smear 2018.  No Pap smear done today.  No history of abnormal Pap smears.  Plan repeat Pap smear at 3-year interval per current screening guidelines. 3. Coming due for mammogram and patient will schedule.  Breast exam normal today. 4. Health maintenance.  Patient will continue to follow-up with Dr. Cruzita Lederer for thyroid monitoring.  Patient is fasting today and requests baseline labs.  CBC, CMP, lipid profile, vitamin D and urine analysis ordered.  Follow-up 1 year, sooner if menstrual irregularity continues.   [name] MD, 8:32 AM 10/21/2017

## 2017-10-22 LAB — URINE CULTURE
MICRO NUMBER:: 90536880
SPECIMEN QUALITY: ADEQUATE

## 2017-10-22 LAB — URINALYSIS, COMPLETE W/RFL CULTURE
Bacteria, UA: NONE SEEN /HPF
Bilirubin Urine: NEGATIVE
GLUCOSE, UA: NEGATIVE
HGB URINE DIPSTICK: NEGATIVE
HYALINE CAST: NONE SEEN /LPF
KETONES UR: NEGATIVE
LEUKOCYTE ESTERASE: NEGATIVE
Nitrites, Initial: NEGATIVE
Protein, ur: NEGATIVE
RBC / HPF: NONE SEEN /HPF (ref 0–2)
Specific Gravity, Urine: 1.026 (ref 1.001–1.03)
WBC UA: NONE SEEN /HPF (ref 0–5)

## 2017-10-22 LAB — NO CULTURE INDICATED

## 2017-10-22 LAB — VITAMIN D 25 HYDROXY (VIT D DEFICIENCY, FRACTURES): VIT D 25 HYDROXY: 49 ng/mL (ref 30–100)

## 2017-10-26 ENCOUNTER — Encounter: Payer: Self-pay | Admitting: Gynecology

## 2017-11-11 ENCOUNTER — Ambulatory Visit
Admission: RE | Admit: 2017-11-11 | Discharge: 2017-11-11 | Disposition: A | Discharge status: Home / Self Care | Payer: 59 | Source: Ambulatory Visit | Attending: Gynecology | Admitting: Gynecology

## 2017-11-11 DIAGNOSIS — Z1231 Encounter for screening mammogram for malignant neoplasm of breast: Secondary | ICD-10-CM

## 2018-01-06 ENCOUNTER — Other Ambulatory Visit (INDEPENDENT_AMBULATORY_CARE_PROVIDER_SITE_OTHER): Payer: 59

## 2018-01-06 DIAGNOSIS — E063 Autoimmune thyroiditis: Secondary | ICD-10-CM | POA: Diagnosis not present

## 2018-01-06 DIAGNOSIS — E038 Other specified hypothyroidism: Secondary | ICD-10-CM | POA: Diagnosis not present

## 2018-01-06 LAB — T4, FREE: FREE T4: 0.77 ng/dL (ref 0.60–1.60)

## 2018-01-06 LAB — T3, FREE: T3, Free: 2.8 pg/mL (ref 2.3–4.2)

## 2018-01-06 LAB — TSH: TSH: 3.84 u[IU]/mL (ref 0.35–4.50)

## 2018-05-06 ENCOUNTER — Encounter: Payer: Self-pay | Admitting: Gynecology

## 2018-05-06 ENCOUNTER — Ambulatory Visit: Payer: 59 | Admitting: Gynecology

## 2018-05-06 VITALS — BP 116/74

## 2018-05-06 DIAGNOSIS — N926 Irregular menstruation, unspecified: Secondary | ICD-10-CM

## 2018-05-06 NOTE — Patient Instructions (Signed)
Follow up for ultrasound as scheduled 

## 2018-05-06 NOTE — Progress Notes (Signed)
    Shari George Nov 18, 1970 [id number]        47 y.o.  G2P2 presents with history of menses monthly but very as to when it starts.  Skipped October and then started several days ago with heavy flow.  Normally her flows are relatively light.  Has some mild cramping but not of significance.  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  Directed ROS with pertinent positives and negatives documented in the history of present illness/assessment and plan.  Exam: Shari George assistant Vitals:   05/06/18 1028  BP: 116/74   General appearance:  Normal Abdomen soft nontender without masses guarding rebound Pelvic external BUS vagina with menstrual flow.  Cervix normal.  Uterus normal size midline mobile nontender.  Adnexa without masses or tenderness  Assessment/Plan:  47 y.o. G2P2 with menstrual irregularity.  Heavy menses now which is resolving after skipped month.  Thyroid levels checked routinely.  Will check FSH now.  We discussed the issues of ovulatory irregularity as well as early menopause.  Recommended also ultrasound recheck her left ovary with history of fibroma/fibroadenoma and also allow for endometrial echo assessment/rule out myomas.  Possible progesterone withdrawals if 5 weeks without menses also discussed.    Shari Auerbach MD, 10:51 AM 05/06/2018

## 2018-05-07 LAB — FOLLICLE STIMULATING HORMONE: FSH: 3.3 m[IU]/mL

## 2018-05-09 ENCOUNTER — Ambulatory Visit (INDEPENDENT_AMBULATORY_CARE_PROVIDER_SITE_OTHER): Payer: 59

## 2018-05-09 ENCOUNTER — Encounter: Payer: Self-pay | Admitting: Gynecology

## 2018-05-09 ENCOUNTER — Other Ambulatory Visit: Payer: Self-pay | Admitting: Gynecology

## 2018-05-09 ENCOUNTER — Ambulatory Visit: Payer: 59 | Admitting: Gynecology

## 2018-05-09 VITALS — BP 118/70

## 2018-05-09 DIAGNOSIS — N926 Irregular menstruation, unspecified: Secondary | ICD-10-CM

## 2018-05-09 DIAGNOSIS — N83202 Unspecified ovarian cyst, left side: Secondary | ICD-10-CM

## 2018-05-09 DIAGNOSIS — N838 Other noninflammatory disorders of ovary, fallopian tube and broad ligament: Secondary | ICD-10-CM

## 2018-05-09 NOTE — Patient Instructions (Signed)
Follow up for ultrasound as scheduled 

## 2018-05-09 NOTE — Progress Notes (Signed)
    Shari George 07/18/1970 [id number]        47 y.o.  G2P2 presents for ultrasound.  History of irregular menses.  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  Past medical history,surgical history, problem list, medications, allergies, family history and social history were all reviewed and documented in the EPIC chart.  Directed ROS with pertinent positives and negatives documented in the history of present illness/assessment and plan.  Exam: Vitals:   05/09/18 1144  BP: 118/70   General appearance:  Normal  Ultrasound transvaginal shows uterus normal size and echotexture.  Endometrial echo 5.2 mm.  Right adnexa is negative consistent with history of RSO.  Left ovary enlarged with rim of tissue noting thin-walled echo-free cyst 55 x 38 x 54 mm (49 mm mean).  Negative color flow Doppler.  Separate cystic area consistent with small follicle 24 x 19 mm.  Cul-de-sac negative.  Assessment/Plan:  47 y.o. G2P2 with mild menstrual irregularity.  History of fibroma and fibroadenoma with RSO.  Left ovary enlarged with cystic echo-free area negative color flow Doppler.  Reviewed possibilities with the patient to include physiologic, benign tumor versus malignant ovarian tumor.  Given the smaller size, avascular nature and simplicity of the cystic change all consistent with benign etiology although cannot guarantee.  Options for management to include surgery now versus expectant management with follow-up ultrasound discussed.  Possible surgery in the future if cystic changes persist/enlarge.  Patient's comfortable/prefers expectant management at this time with follow-up ultrasound 2 to 3 months.  Will check baseline CA 125 today.  Follow-up sooner if pain or significant menstrual irregularity.    [name] MD, 12:03 PM 05/09/2018

## 2018-05-11 ENCOUNTER — Encounter: Payer: Self-pay | Admitting: Gynecology

## 2018-05-11 LAB — CA 125: CA 125: 4 U/mL (ref <35)

## 2018-07-11 ENCOUNTER — Encounter: Payer: Self-pay | Admitting: Internal Medicine

## 2018-07-11 ENCOUNTER — Other Ambulatory Visit: Payer: Self-pay | Admitting: Internal Medicine

## 2018-07-11 ENCOUNTER — Ambulatory Visit: Payer: 59 | Admitting: Internal Medicine

## 2018-07-11 VITALS — BP 108/60 | HR 46 | Ht 69.0 in | Wt 152.0 lb

## 2018-07-11 DIAGNOSIS — E063 Autoimmune thyroiditis: Principal | ICD-10-CM

## 2018-07-11 DIAGNOSIS — E038 Other specified hypothyroidism: Secondary | ICD-10-CM

## 2018-07-11 LAB — T4, FREE: Free T4: 0.77 ng/dL (ref 0.60–1.60)

## 2018-07-11 LAB — T3, FREE: T3 FREE: 2.5 pg/mL (ref 2.3–4.2)

## 2018-07-11 LAB — TSH: TSH: 3.73 u[IU]/mL (ref 0.35–4.50)

## 2018-07-11 NOTE — Progress Notes (Signed)
Patient ID: Shari George, female   DOB: 1971/01/11, 48 y.o.   MRN: [id number]    HPI  Shari George is a 48 y.o.-year-old female, initially referred by Dr Uvaldo Rising, returning for follow-up for Hashimoto's thyroiditis with history of mild hypothyroidism. She moved from [place] in 2015.  Last visit 1 year ago.  Pt. has been found to have a high TSH in 2017 >> they were mildly elevated afterwards, but they normalized 2 years ago.  She is not on thyroid hormones.  Reviewed patient's TFTs: Lab Results  Component Value Date   TSH 3.84 01/06/2018   TSH 4.44 07/09/2017   TSH 4.00 01/06/2017   TSH 4.78 (H) 10/19/2016   TSH 4.69 (H) 10/14/2016   TSH 5.34 (H) 10/21/2015   TSH 6.27 (H) 10/14/2015   TSH 4.189 11/08/2013   FREET4 0.77 01/06/2018   FREET4 0.89 07/09/2017   FREET4 0.76 01/06/2017    Her thyroid antibodies were elevated, giving her a diagnosis of Hashimoto's thyroiditis: Component     Latest Ref Rng & Units 01/06/2017  Thyroperoxidase Ab SerPl-aCnc     <9 IU/mL <1  Thyroglobulin Ab     <2 IU/mL 8 (H)   Pt denies: - feeling nodules in neck - hoarseness - dysphagia - choking - SOB with lying down  She has no FH of thyroid disorders. No FH of thyroid cancer. No h/o radiation tx to head or neck.  No seaweed or kelp. No recent contrast studies. No herbal supplements. No Biotin use. No recent steroids use.   She usually has a higher creatinine: Lab Results  Component Value Date   CREATININE 1.12 (H) 10/21/2017   CREATININE 1.19 03/04/2017   CREATININE 1.20 (H) 10/21/2016   CREATININE 1.24 (H) 10/19/2016   CREATININE 1.30 (H) 10/14/2016   CREATININE 1.04 10/14/2015   CREATININE 1.1 07/03/2014   CREATININE 1.07 11/08/2013   She is exercising consistently, but not excessively.  No creatine supplements.  No diabetes, HTN  ROS: Constitutional: no weight gain/no weight loss, no fatigue, no subjective hyperthermia, no subjective hypothermia Eyes: no blurry  vision, no xerophthalmia ENT: no sore throat, + see HPI Cardiovascular: no CP/no SOB/no palpitations/no leg swelling Respiratory: no cough/no SOB/no wheezing Gastrointestinal: no N/no V/no D/no C/no acid reflux Musculoskeletal: no muscle aches/no joint aches Skin: no rashes, no hair loss Neurological: no tremors/no numbness/no tingling/no dizziness  I reviewed pt's medications, allergies, PMH, social hx, family hx, and changes were documented in the history of present illness. Otherwise, unchanged from my initial visit note.  Past Medical History:  Diagnosis Date  . Environmental allergies   . History of chicken pox   . UTI (lower urinary tract infection)    Past Surgical History:  Procedure Laterality Date  . KNEE SURGERY Left   . LAPAROSCOPIC SALPINGO OOPHERECTOMY Right 09/04/2014   Procedure: LAPAROSCOPIC SALPINGO OOPHORECTOMY;  Surgeon: Terrance Mass, MD;  Location: [facility];  Service: Gynecology;  Laterality: Right;  . LAPAROSCOPIC UNILATERAL SALPINGECTOMY Left 09/04/2014   Procedure: LAPAROSCOPIC UNILATERAL SALPINGECTOMY;  Surgeon: Terrance Mass, MD;  Location: [facility];  Service: Gynecology;  Laterality: Left;  . left  knee   1988   " Bone lying on muscle"    Social History   Social History  . Marital status: Married    Spouse name: N/A  . Number of children: 2   Occupational History  . Stay at home mom   Social History Main Topics  . Smoking status: Never Smoker  .  Smokeless tobacco: Never Used  . Alcohol use 1.2 oz/week    2 Glasses of wine per week     Comment: MODERATE  . Drug use: No  . Sexual activity: Yes   Current Outpatient Medications on File Prior to Visit  Medication Sig Dispense Refill  . Calcium-Vitamin D 600-200 MG-UNIT per tablet Take 1 tablet by mouth daily.     No current facility-administered medications on file prior to visit.    No Known Allergies   Family History  Problem Relation Age of Onset  . Hypertension Father        Living   . Heart Problems Father   . Heart disease Paternal Grandfather   . Arthritis Paternal Aunt    PE: BP 108/60   Pulse (!) 46   Ht 5\' 9"  (1.753 m) Comment: measured  Wt 152 lb (68.9 kg)   SpO2 99%   BMI 22.45 kg/m  Wt Readings from Last 3 Encounters:  07/11/18 152 lb (68.9 kg)  10/21/17 150 lb (68 kg)  07/09/17 151 lb 3.2 oz (68.6 kg)   Constitutional: Normal weight, in NAD Eyes: PERRLA, EOMI, no exophthalmos ENT: moist mucous membranes, no thyromegaly, no cervical lymphadenopathy Cardiovascular: RRR, No MRG Respiratory: CTA B Gastrointestinal: abdomen soft, NT, ND, BS+ Musculoskeletal: no deformities, strength intact in all 4 Skin: moist, warm, no rashes Neurological: no tremor with outstretched hands, DTR normal in all 4  ASSESSMENT: 1.  Hashimoto's hypothyroidism  PLAN:  1. Patient with history of elevated TSH, with possible mild hypothyroid symptoms: Weight gain, fatigue, hair loss initially, now all resolved.  We diagnosed Hashimoto's thyroiditis at the previous visit, however, her TFTs were normal at that time so we did not start thyroid hormones yet.  At last visit, TFTs remained normal, although TSH was close to the upper limit of normal. -She appears euthyroid -No goiter, thyroid nodules, or neck compression symptoms -We again discussed that, with normal thyroid test, we do not have many options to treat her Hashimoto's thyroiditis.  Hypothyroidism can developed over months or years so we need to continue to check her thyroid test, but, as of now, she does not seem to require levothyroxine.  We will check her thyroid tests today and we discussed about starting levothyroxine if her TFTs return abnormal. -I advised her how to take the hormone if we do need to start: Fasting, with water, at least 30 minutes before breakfast, separated by at least 4 hours from acid reflux medicines, calcium, iron, multivitamins. -We discussed about subclinical hypothyroidism in general and  also about the indications for treatment:  Desire for pregnancy  TSH higher than 10  TSH lower than 10 with signs or symptoms consistent with hypothyroidism -At today's visit, we will check a TSH, free T4, free T3 -If labs return abnormal, she will need to return in 5 to 6 weeks for repeat set of labs - Otherwise, I will see her back in 1 year, but in 6 months for repeat labs  - time spent with the patient: 15 min, of which >50% was spent in obtaining information about her symptoms, reviewing her previous labs, evaluations, and treatments, counseling her about her condition (please see the discussed topics above), and developing a plan to further investigate and treat it.  Office Visit on 07/11/2018  Component Date Value Ref Range Status  . TSH 07/11/2018 3.73  0.35 - 4.50 uIU/mL Final  . Free T4 07/11/2018 0.77  0.60 - 1.60 ng/dL Final   Comment:  Specimens from patients who are undergoing biotin therapy and /or ingesting biotin supplements may contain high levels of biotin.  The higher biotin concentration in these specimens interferes with this Free T4 assay.  Specimens that contain high levels  of biotin may cause false high results for this Free T4 assay.  Please interpret results in light of the total clinical presentation of the patient.    . T3, Free 07/11/2018 2.5  2.3 - 4.2 pg/mL Final   Normal TFTs.  Philemon Kingdom, MD PhD [facility]

## 2018-07-11 NOTE — Patient Instructions (Signed)
Please stop at the lab.  Please come back for labs in 6 months and for a visit in 1 year. 

## 2018-07-12 ENCOUNTER — Ambulatory Visit: Payer: 59 | Admitting: Gynecology

## 2018-07-12 ENCOUNTER — Other Ambulatory Visit: Payer: 59

## 2018-07-18 ENCOUNTER — Ambulatory Visit (INDEPENDENT_AMBULATORY_CARE_PROVIDER_SITE_OTHER): Payer: 59

## 2018-07-18 ENCOUNTER — Ambulatory Visit (INDEPENDENT_AMBULATORY_CARE_PROVIDER_SITE_OTHER): Payer: 59 | Admitting: Gynecology

## 2018-07-18 ENCOUNTER — Encounter: Payer: Self-pay | Admitting: Gynecology

## 2018-07-18 VITALS — BP 114/60

## 2018-07-18 DIAGNOSIS — N83202 Unspecified ovarian cyst, left side: Secondary | ICD-10-CM

## 2018-07-18 NOTE — Progress Notes (Signed)
    Shari George 05-30-1971 [id number]        47 y.o.  G2P2 for follow-up ultrasound.  History of irregular menses with ultrasound in November 2019 showing 49 mm mean thin-walled echo-free cyst negative color flow.  Separate smaller cyst at 24 x 19 mm.  CA 125 value 7.  Options for observation versus intervention were reviewed and she was comfortable with observation as she is asymptomatic and continues to have no symptoms as far as pelvic pressure pain or otherwise.  Past medical history,surgical history, problem list, medications, allergies, family history and social history were all reviewed and documented in the EPIC chart.  Directed ROS with pertinent positives and negatives documented in the history of present illness/assessment and plan.  Exam: Vitals:   07/18/18 0924  BP: 114/60   General appearance:  Normal  Ultrasound transvaginal shows uterus normal size and echotexture.  Endometrial echo 6.1 mm.  Right adnexa is negative consistent with history of RSO.  Left ovary with small follicles less than 26 mm.  Thin-walled cystic mass 40 mm mean with some low-level internal echoes.  Layering of debris noted.  Color flow Doppler negative.  Some fluid in the cul-de-sac 54 x 21 x 47 mm.  Assessment/Plan:  48 y.o. G2P2 with persistence of a left ovarian cyst, same if not smaller previously 49 mm now 40 mm.  Some layering.  Negative color flow.  Previous CA 125 was 7.  We again discussed the differential to include benign versus malignant.  Given the lack of growth and in fact seems a little smaller unlikely malignant but no guarantees.  Options for surgical intervention with removal versus continued observation discussed.  Patient continues to be asymptomatic without pressure or pain.  At this point the patient defers expectant management.  We will plan follow-up ultrasound in 6 months.  Patient reports having an annual exam in the interim and will follow-up for that first and then ultimately  the ultrasound in 6 months.    [name] MD, 10:13 AM 07/18/2018

## 2018-07-18 NOTE — Patient Instructions (Signed)
Follow up for ultrasound as scheduled 

## 2018-10-25 ENCOUNTER — Other Ambulatory Visit: Payer: Self-pay

## 2018-10-27 ENCOUNTER — Other Ambulatory Visit: Payer: Self-pay

## 2018-10-27 ENCOUNTER — Ambulatory Visit: Payer: 59 | Admitting: Gynecology

## 2018-10-27 ENCOUNTER — Encounter: Payer: Self-pay | Admitting: Gynecology

## 2018-10-27 VITALS — BP 116/74 | Ht 69.0 in | Wt 153.0 lb

## 2018-10-27 DIAGNOSIS — Z13220 Encounter for screening for lipoid disorders: Secondary | ICD-10-CM

## 2018-10-27 DIAGNOSIS — N926 Irregular menstruation, unspecified: Secondary | ICD-10-CM

## 2018-10-27 DIAGNOSIS — Z01419 Encounter for gynecological examination (general) (routine) without abnormal findings: Secondary | ICD-10-CM

## 2018-10-27 DIAGNOSIS — N83202 Unspecified ovarian cyst, left side: Secondary | ICD-10-CM | POA: Diagnosis not present

## 2018-10-27 NOTE — Patient Instructions (Signed)
Follow-up for the ultrasound as scheduled. 

## 2018-10-27 NOTE — Progress Notes (Signed)
    Shari George 1971/03/19 [id number]        48 y.o.  G2P2 for annual gynecologic exam.  Notes some irregularity in her cycles where she will skip a month intermittently.  No prolonged or atypical bleeding.  History of persistent left ovarian cyst measuring 40 mm with low-level echoes.  Normal CA 125.  No pain or other symptoms.  Initial ultrasound in November with follow-up ultrasound in January.  Was recommended for 22-month follow-up which patient is going to schedule.  Past medical history,surgical history, problem list, medications, allergies, family history and social history were all reviewed and documented as reviewed in the EPIC chart.  ROS:  Performed with pertinent positives and negatives included in the history, assessment and plan.   Additional significant findings : None   Exam: [name] assistant Vitals:   10/27/18 0801  BP: 116/74  Weight: 153 lb (69.4 kg)  Height: 5\' 9"  (1.753 m)   Body mass index is 22.59 kg/m.  General appearance:  Normal affect, orientation and appearance. Skin: Grossly normal HEENT: Without gross lesions.  No cervical or supraclavicular adenopathy. Thyroid normal.  Lungs:  Clear without wheezing, rales or rhonchi Cardiac: RR, without RMG Abdominal:  Soft, nontender, without masses, guarding, rebound, organomegaly or hernia Breasts:  Examined lying and sitting without masses, retractions, discharge or axillary adenopathy. Pelvic:  Ext, BUS, Vagina: Normal  Cervix: Normal Pap smear done  Uterus: Anteverted, normal size, shape and contour, midline and mobile nontender   Adnexa: Without masses or tenderness    Anus and perineum: Normal   Rectovaginal: Normal sphincter tone without palpated masses or tenderness.    Assessment/Plan:  48 y.o. G2P2 female for annual gynecologic exam.  Treated bilateral salpingectomies with RSO 2017 for ovarian fibroma  1. Irregular menses.  Some skips in her menses.  No prolonged or atypical bleeding.  Has  been followed for thyroid elsewhere with normal TSH this year.  Will check FSH.  Options for management to include observation versus intervention such as menstrual regulation with low-dose oral contraceptives discussed.  At this point will monitor and follow-up if significant irregularity develops. 2. Persistent left ovarian cyst with low-level echoes.  Questionable endometrioma.  Stable on 31-month follow-up ultrasound in January with negative CA 125.  Schedule follow-up ultrasound now for 78-month follow-up and patient will arrange. 3. Mammography due now and patient is in the process of scheduling.  Breast exam normal today. 4. Pap smear 09/2016.  Pap smear done today.  No history of abnormal Pap smears previously. 5. Health maintenance.  Requests baseline labs.  CBC, CMP, lipid profile ordered along with her [facility].  Follow-up for ultrasound.  Follow-up for annual exam in 1 year.   [name] MD, 8:25 AM 10/27/2018

## 2018-10-27 NOTE — Addendum Note (Signed)
Addended by: Nelva Nay on: 10/27/2018 08:49 AM   Modules accepted: Orders

## 2018-10-28 LAB — COMPREHENSIVE METABOLIC PANEL
AG Ratio: 1.6 (calc) (ref 1.0–2.5)
ALT: 9 U/L (ref 6–29)
AST: 16 U/L (ref 10–35)
Albumin: 4.2 g/dL (ref 3.6–5.1)
Alkaline phosphatase (APISO): 30 U/L — ABNORMAL LOW (ref 31–125)
BUN: 16 mg/dL (ref 7–25)
CO2: 26 mmol/L (ref 20–32)
Calcium: 8.9 mg/dL (ref 8.6–10.2)
Chloride: 108 mmol/L (ref 98–110)
Creat: 1.06 mg/dL (ref 0.50–1.10)
Globulin: 2.6 g/dL (calc) (ref 1.9–3.7)
Glucose, Bld: 81 mg/dL (ref 65–99)
Potassium: 4 mmol/L (ref 3.5–5.3)
Sodium: 139 mmol/L (ref 135–146)
Total Bilirubin: 0.8 mg/dL (ref 0.2–1.2)
Total Protein: 6.8 g/dL (ref 6.1–8.1)

## 2018-10-28 LAB — LIPID PANEL
Cholesterol: 150 mg/dL (ref <200)
HDL: 72 mg/dL (ref >=50)
LDL Cholesterol (Calc): 65 mg/dL (calc)
Non-HDL Cholesterol (Calc): 78 mg/dL (calc) (ref <130)
Total CHOL/HDL Ratio: 2.1 (calc) (ref <5.0)
Triglycerides: 52 mg/dL (ref <150)

## 2018-10-28 LAB — CBC WITH DIFFERENTIAL/PLATELET
Absolute Monocytes: 583 cells/uL (ref 200–950)
Basophils Absolute: 32 cells/uL (ref 0–200)
Basophils Relative: 0.6 %
Eosinophils Absolute: 249 cells/uL (ref 15–500)
Eosinophils Relative: 4.7 %
HCT: 39.3 % (ref 35.0–45.0)
Hemoglobin: 13.3 g/dL (ref 11.7–15.5)
Lymphs Abs: 1505 cells/uL (ref 850–3900)
MCH: 32.4 pg (ref 27.0–33.0)
MCHC: 33.8 g/dL (ref 32.0–36.0)
MCV: 95.9 fL (ref 80.0–100.0)
MPV: 11.1 fL (ref 7.5–12.5)
Monocytes Relative: 11 %
Neutro Abs: 2931 cells/uL (ref 1500–7800)
Neutrophils Relative %: 55.3 %
Platelets: 227 10*3/uL (ref 140–400)
RBC: 4.1 10*6/uL (ref 3.80–5.10)
RDW: 11.5 % (ref 11.0–15.0)
Total Lymphocyte: 28.4 %
WBC: 5.3 10*3/uL (ref 3.8–10.8)

## 2018-10-28 LAB — PAP IG W/ RFLX HPV ASCU

## 2018-10-28 LAB — FOLLICLE STIMULATING HORMONE: FSH: 4.9 m[IU]/mL

## 2018-11-01 ENCOUNTER — Other Ambulatory Visit: Payer: Self-pay

## 2018-11-02 ENCOUNTER — Other Ambulatory Visit: Payer: Self-pay | Admitting: Medical

## 2018-11-02 ENCOUNTER — Other Ambulatory Visit: Payer: Self-pay | Admitting: Gynecology

## 2018-11-02 DIAGNOSIS — Z1231 Encounter for screening mammogram for malignant neoplasm of breast: Secondary | ICD-10-CM

## 2018-11-03 ENCOUNTER — Encounter: Payer: Self-pay | Admitting: Gynecology

## 2018-11-03 ENCOUNTER — Other Ambulatory Visit: Payer: Self-pay

## 2018-11-03 ENCOUNTER — Other Ambulatory Visit: Payer: Self-pay | Admitting: Gynecology

## 2018-11-03 ENCOUNTER — Ambulatory Visit (INDEPENDENT_AMBULATORY_CARE_PROVIDER_SITE_OTHER): Payer: 59

## 2018-11-03 ENCOUNTER — Ambulatory Visit (INDEPENDENT_AMBULATORY_CARE_PROVIDER_SITE_OTHER): Payer: 59 | Admitting: Gynecology

## 2018-11-03 VITALS — BP 112/66

## 2018-11-03 DIAGNOSIS — N83202 Unspecified ovarian cyst, left side: Secondary | ICD-10-CM

## 2018-11-03 NOTE — Patient Instructions (Signed)
Follow-up in 1 year for annual exam, sooner if any issues. 

## 2018-11-03 NOTE — Progress Notes (Signed)
    Shari George 03-31-1971 [id number]        48 y.o.  G2P2 resents for follow-up ultrasound.  She had and ultrasound in November 2019 which showed a 55 x 38 x 54 mm left ovarian cyst.  CA 125 was 7.  Follow-up in January 2020 showing a left 40 mm low level echo cyst with layering of debris color flow negative.  Also small follicles less than 26 mm.  Cul-de-sac fluid of 54 x 21 x 47 mm.  Status post RSO in the past.  Presents for follow-up ultrasound now.  Without complaints  Past medical history,surgical history, problem list, medications, allergies, family history and social history were all reviewed and documented in the EPIC chart.  Directed ROS with pertinent positives and negatives documented in the history of present illness/assessment and plan.  Exam: Vitals:   11/03/18 1231  BP: 112/66   General appearance:  Normal  Ultrasound transvaginal shows uterus normal size and echotexture.  No myometrial abnormalities noted.  Endometrial echo 8.2 mm.  Right ovary surgically absent.  Right adnexa negative.  Left ovary with 2 simple appearing follicles measuring 28 x 21 mm and 21 x 11 mm.  Previous cyst with debris no longer visible.  Cul-de-sac negative with no free fluid.  Assessment/Plan:  48 y.o. G2P2 with resolution of the prior cyst.  2 follicles noted left ovary.  Discussed with patient.  Plan expectant management and she will follow-up if she has any pain or other issues otherwise we will follow-up in 1 year for annual exam.    [name] MD, 12:39 PM 11/03/2018

## 2018-12-27 ENCOUNTER — Other Ambulatory Visit: Payer: Self-pay

## 2018-12-27 ENCOUNTER — Ambulatory Visit
Admission: RE | Admit: 2018-12-27 | Discharge: 2018-12-27 | Disposition: A | Discharge status: Home / Self Care | Payer: 59 | Source: Ambulatory Visit | Attending: Gynecology | Admitting: Gynecology

## 2018-12-27 DIAGNOSIS — Z1231 Encounter for screening mammogram for malignant neoplasm of breast: Secondary | ICD-10-CM

## 2019-01-09 ENCOUNTER — Other Ambulatory Visit (INDEPENDENT_AMBULATORY_CARE_PROVIDER_SITE_OTHER): Payer: 59

## 2019-01-09 ENCOUNTER — Other Ambulatory Visit: Payer: Self-pay

## 2019-01-09 DIAGNOSIS — E063 Autoimmune thyroiditis: Secondary | ICD-10-CM

## 2019-01-09 DIAGNOSIS — E038 Other specified hypothyroidism: Secondary | ICD-10-CM | POA: Diagnosis not present

## 2019-01-09 LAB — T3, FREE: T3, Free: 2.3 pg/mL (ref 2.3–4.2)

## 2019-01-09 LAB — TSH: TSH: 4.26 u[IU]/mL (ref 0.35–4.50)

## 2019-01-09 LAB — T4, FREE: Free T4: 0.68 ng/dL (ref 0.60–1.60)

## 2019-03-14 ENCOUNTER — Encounter: Payer: Self-pay | Admitting: Gynecology

## 2019-07-10 ENCOUNTER — Other Ambulatory Visit: Payer: Self-pay

## 2019-07-11 NOTE — Progress Notes (Signed)
Patient ID: Shari George, female   DOB: 1970/08/28, 49 y.o.   MRN: [id number]   This visit occurred during the SARS-CoV-2 public health emergency.  Safety protocols were in place, including screening questions prior to the visit, additional usage of staff PPE, and extensive cleaning of exam room while observing appropriate contact time as indicated for disinfecting solutions.   HPI  Shari George is a 49 y.o.-year-old female, initially referred by Dr Shari George, returning for follow-up for Hashimoto's thyroiditis with history of mild hypothyroidism. She moved from [place] in 2015.  Last visit 1 year ago.  She has been found to have slightly high TSH in 2017-2018 but the tests normalized afterwards, without intervention.  She is not on thyroid hormones.  She describes decreased ability to focus lately.  Reviewed patient's TFTs: Lab Results  Component Value Date   TSH 4.26 01/09/2019   TSH 3.73 07/11/2018   TSH 3.84 01/06/2018   TSH 4.44 07/09/2017   TSH 4.00 01/06/2017   TSH 4.78 (H) 10/19/2016   TSH 4.69 (H) 10/14/2016   TSH 5.34 (H) 10/21/2015   TSH 6.27 (H) 10/14/2015   TSH 4.189 11/08/2013   FREET4 0.68 01/09/2019   FREET4 0.77 07/11/2018   FREET4 0.77 01/06/2018   FREET4 0.89 07/09/2017   FREET4 0.76 01/06/2017    Lab Results  Component Value Date   T3FREE 2.3 01/09/2019   T3FREE 2.5 07/11/2018   T3FREE 2.8 01/06/2018   T3FREE 2.9 07/09/2017   T3FREE 2.6 01/06/2017   Her ATA antibodies were elevated: Component     Latest Ref Rng & Units 01/06/2017  Thyroperoxidase Ab SerPl-aCnc     <9 IU/mL <1  Thyroglobulin Ab     <2 IU/mL 8 (H)   Pt denies: - feeling nodules in neck - hoarseness - dysphagia - choking - SOB with lying down  She has no FH of thyroid disorders. No FH of thyroid cancer. No h/o radiation tx to head or neck.  No seaweed or kelp. No recent contrast studies. No herbal supplements. No Biotin use. No recent steroids use.   She usually  has a higher creatinine level: Lab Results  Component Value Date   CREATININE 1.06 10/27/2018   CREATININE 1.12 (H) 10/21/2017   CREATININE 1.19 03/04/2017   CREATININE 1.20 (H) 10/21/2016   CREATININE 1.24 (H) 10/19/2016   CREATININE 1.30 (H) 10/14/2016   CREATININE 1.04 10/14/2015   CREATININE 1.1 07/03/2014   CREATININE 1.07 11/08/2013   She continues to exercise consistently.  No creatine supplements.  No diabetes, hypertension.  ROS: Constitutional: no weight gain/no weight loss, + fatigue, no subjective hyperthermia, no subjective hypothermia Eyes: no blurry vision, no xerophthalmia ENT: no sore throat, + see HPI Cardiovascular: no CP/no SOB/no palpitations/no leg swelling Respiratory: no cough/no SOB/no wheezing Gastrointestinal: no N/no V/no D/no C/no acid reflux Musculoskeletal: no muscle aches/no joint aches Skin: no rashes, no hair loss Neurological: no tremors/no numbness/no tingling/no dizziness  I reviewed pt's medications, allergies, PMH, social hx, family hx, and changes were documented in the history of present illness. Otherwise, unchanged from my initial visit note.  Past Medical History:  Diagnosis Date  . Environmental allergies   . History of chicken pox   . UTI (lower urinary tract infection)    Past Surgical History:  Procedure Laterality Date  . KNEE SURGERY Left   . LAPAROSCOPIC SALPINGO OOPHERECTOMY Right 09/04/2014   Procedure: LAPAROSCOPIC SALPINGO OOPHORECTOMY;  Surgeon: Terrance Mass, MD;  Location: [facility];  Service: Gynecology;  Laterality: Right;  . LAPAROSCOPIC UNILATERAL SALPINGECTOMY Left 09/04/2014   Procedure: LAPAROSCOPIC UNILATERAL SALPINGECTOMY;  Surgeon: Terrance Mass, MD;  Location: [facility];  Service: Gynecology;  Laterality: Left;  . left  knee   1988   " Bone lying on muscle"    Social History   Social History  . Marital status: Married    Spouse name: N/A  . Number of children: 2   Occupational History  . Stay at  home mom   Social History Main Topics  . Smoking status: Never Smoker  . Smokeless tobacco: Never Used  . Alcohol use 1.2 oz/week    2 Glasses of wine per week     Comment: MODERATE  . Drug use: No  . Sexual activity: Yes   Current Outpatient Medications on File Prior to Visit  Medication Sig Dispense Refill  . Calcium-Vitamin D 600-200 MG-UNIT per tablet Take 1 tablet by mouth daily.     No current facility-administered medications on file prior to visit.   No Known Allergies   Family History  Problem Relation Age of Onset  . Hypertension Father        Living  . Heart Problems Father   . Heart disease Paternal Grandfather   . Arthritis Paternal Aunt    PE: BP 102/60   Pulse (!) 55   Ht 5\' 9"  (1.753 m)   Wt 152 lb (68.9 kg)   SpO2 99%   BMI 22.45 kg/m  Wt Readings from Last 3 Encounters:  07/12/19 152 lb (68.9 kg)  10/27/18 153 lb (69.4 kg)  07/11/18 152 lb (68.9 kg)   Constitutional: normal weight, in NAD Eyes: PERRLA, EOMI, no exophthalmos ENT: moist mucous membranes, no thyromegaly, no cervical lymphadenopathy Cardiovascular: RRR, No MRG Respiratory: CTA B Gastrointestinal: abdomen soft, NT, ND, BS+ Musculoskeletal: no deformities, strength intact in all 4 Skin: moist, warm, no rashes Neurological: no tremor with outstretched hands, DTR normal in all 4   ASSESSMENT: 1.  Hashimoto's hypothyroidism  PLAN:  1. Patient with history of elevated TSH approximately 3 years ago, with possible mild hypothyroid symptoms: Weight gain, fatigue, hair loss initially, then all resolved.  We diagnosed Hashimoto's thyroiditis based on elevated ATA antibodies.  Her TFTs normalized and stayed in the upper range of the normal interval, including at last visit, a year ago. -She appears euthyroid and has no complaints at this visit other than mild decreased ability to focus and mild fatigue -Also, no goiter, no neck pressure, or other neck compression symptoms -We again  discussed that if the thyroid tests remain in the normal range, no intervention is needed.  Even if the TSH increases slightly above the upper limit of normal, in the presence of normal free thyroid hormones, treatment is not mandatory. -We again discussed about indications for treatment for subclinical hypothyroidism:  Pregnant or desire for pregnancy  TSH higher than 10  TSH lower than 10 with signs or symptoms consistent with hypothyroidism -At this visit, we will recheck her TFTs to see if she needs to start levothyroxine. Will also check TPO and ATA Ab's and start Selenium if elevated. If we do start Selenium, will need to repeat the Ab titers in 6 mo -If labs return abnormal, she will need to return in 5 to 6 weeks for repeat set of labs - Otherwise, I will see her back in a year, but with labs in 6 months  Appointment on 07/12/2019  Component Date Value Ref Range Status  .  T3, Free 07/12/2019 2.7  2.3 - 4.2 pg/mL Final  . Free T4 07/12/2019 0.85  0.60 - 1.60 ng/dL Final   Comment: Specimens from patients who are undergoing biotin therapy and /or ingesting biotin supplements may contain high levels of biotin.  The higher biotin concentration in these specimens interferes with this Free T4 assay.  Specimens that contain high levels  of biotin may cause false high results for this Free T4 assay.  Please interpret results in light of the total clinical presentation of the patient.    Marland Kitchen TSH 07/12/2019 4.75* 0.35 - 4.50 uIU/mL Final   Message sent: Dear Ms. Francesca Oman, The thyroid tests are back and they show slightly high TSH now.  As discussed, starting levothyroxine is not absolutely mandatory for now, we can repeat that tests sooner than 6 months, if you prefer, maybe 3 months from now to check trends.  Please let me know if this is okay with you. Sincerely, Philemon Kingdom MD  Philemon Kingdom, MD PhD [facility] End[facility]

## 2019-07-12 ENCOUNTER — Encounter: Payer: Self-pay | Admitting: Internal Medicine

## 2019-07-12 ENCOUNTER — Ambulatory Visit: Payer: 59 | Admitting: Internal Medicine

## 2019-07-12 ENCOUNTER — Other Ambulatory Visit: Payer: Self-pay

## 2019-07-12 ENCOUNTER — Other Ambulatory Visit (INDEPENDENT_AMBULATORY_CARE_PROVIDER_SITE_OTHER): Payer: 59

## 2019-07-12 VITALS — BP 102/60 | HR 55 | Ht 69.0 in | Wt 152.0 lb

## 2019-07-12 DIAGNOSIS — E038 Other specified hypothyroidism: Secondary | ICD-10-CM

## 2019-07-12 DIAGNOSIS — E063 Autoimmune thyroiditis: Secondary | ICD-10-CM

## 2019-07-12 LAB — T3, FREE: T3, Free: 2.7 pg/mL (ref 2.3–4.2)

## 2019-07-12 LAB — T4, FREE: Free T4: 0.85 ng/dL (ref 0.60–1.60)

## 2019-07-12 LAB — TSH: TSH: 4.75 u[IU]/mL — ABNORMAL HIGH (ref 0.35–4.50)

## 2019-07-12 NOTE — Patient Instructions (Signed)
Please stop at the lab.  Please come back for labs in 6 months and for a visit in 1 year.

## 2019-07-13 LAB — THYROID PEROXIDASE ANTIBODY: Thyroperoxidase Ab SerPl-aCnc: <1 IU/mL (ref <9)

## 2019-07-13 LAB — THYROGLOBULIN ANTIBODY: Thyroglobulin Ab: 10 IU/mL — ABNORMAL HIGH (ref <=1)

## 2019-10-12 ENCOUNTER — Other Ambulatory Visit (INDEPENDENT_AMBULATORY_CARE_PROVIDER_SITE_OTHER): Payer: 59

## 2019-10-12 ENCOUNTER — Other Ambulatory Visit: Payer: Self-pay | Admitting: Internal Medicine

## 2019-10-12 ENCOUNTER — Other Ambulatory Visit: Payer: Self-pay

## 2019-10-12 DIAGNOSIS — E063 Autoimmune thyroiditis: Secondary | ICD-10-CM

## 2019-10-12 DIAGNOSIS — E038 Other specified hypothyroidism: Secondary | ICD-10-CM

## 2019-10-12 LAB — T3, FREE: T3, Free: 2.7 pg/mL (ref 2.3–4.2)

## 2019-10-12 LAB — T4, FREE: Free T4: 0.74 ng/dL (ref 0.60–1.60)

## 2019-10-12 LAB — TSH: TSH: 4.84 u[IU]/mL — ABNORMAL HIGH (ref 0.35–4.50)

## 2019-10-30 ENCOUNTER — Other Ambulatory Visit: Payer: Self-pay

## 2019-10-31 ENCOUNTER — Encounter: Payer: Self-pay | Admitting: Obstetrics and Gynecology

## 2019-10-31 ENCOUNTER — Ambulatory Visit: Payer: 59 | Admitting: Obstetrics and Gynecology

## 2019-10-31 ENCOUNTER — Telehealth: Payer: Self-pay | Admitting: *Deleted

## 2019-10-31 VITALS — BP 118/76 | Ht 69.0 in | Wt 151.0 lb

## 2019-10-31 DIAGNOSIS — N951 Menopausal and female climacteric states: Secondary | ICD-10-CM | POA: Diagnosis not present

## 2019-10-31 DIAGNOSIS — Z13220 Encounter for screening for lipoid disorders: Secondary | ICD-10-CM

## 2019-10-31 DIAGNOSIS — Z01419 Encounter for gynecological examination (general) (routine) without abnormal findings: Secondary | ICD-10-CM | POA: Diagnosis not present

## 2019-10-31 LAB — LIPID PANEL
Cholesterol: 174 mg/dL (ref <200)
HDL: 77 mg/dL (ref >=50)
LDL Cholesterol (Calc): 84 mg/dL (calc)
Non-HDL Cholesterol (Calc): 97 mg/dL (calc) (ref <130)
Total CHOL/HDL Ratio: 2.3 (calc) (ref <5.0)
Triglycerides: 58 mg/dL (ref <150)

## 2019-10-31 LAB — CBC
HCT: 38.9 % (ref 35.0–45.0)
Hemoglobin: 12.7 g/dL (ref 11.7–15.5)
MCH: 31.7 pg (ref 27.0–33.0)
MCHC: 32.6 g/dL (ref 32.0–36.0)
MCV: 97 fL (ref 80.0–100.0)
MPV: 11 fL (ref 7.5–12.5)
Platelets: 221 10*3/uL (ref 140–400)
RBC: 4.01 10*6/uL (ref 3.80–5.10)
RDW: 11.3 % (ref 11.0–15.0)
WBC: 4.6 10*3/uL (ref 3.8–10.8)

## 2019-10-31 LAB — COMPREHENSIVE METABOLIC PANEL
AG Ratio: 1.6 (calc) (ref 1.0–2.5)
ALT: 7 U/L (ref 6–29)
AST: 14 U/L (ref 10–35)
Albumin: 4.2 g/dL (ref 3.6–5.1)
Alkaline phosphatase (APISO): 26 U/L — ABNORMAL LOW (ref 31–125)
BUN: 21 mg/dL (ref 7–25)
CO2: 23 mmol/L (ref 20–32)
Calcium: 9 mg/dL (ref 8.6–10.2)
Chloride: 107 mmol/L (ref 98–110)
Creat: 1.07 mg/dL (ref 0.50–1.10)
Globulin: 2.7 g/dL (calc) (ref 1.9–3.7)
Glucose, Bld: 81 mg/dL (ref 65–99)
Potassium: 4.2 mmol/L (ref 3.5–5.3)
Sodium: 139 mmol/L (ref 135–146)
Total Bilirubin: 0.7 mg/dL (ref 0.2–1.2)
Total Protein: 6.9 g/dL (ref 6.1–8.1)

## 2019-10-31 NOTE — Telephone Encounter (Signed)
Medical records request. Pt has to $25 charge for records. Records up front

## 2019-10-31 NOTE — Progress Notes (Signed)
   Bexli Hodgson [birth year] [id number]  SUBJECTIVE:  49 y.o. G2P2 female for annual routine gynecologic exam and Pap smear. She has no gynecologic concerns.  Just having irregular light periods, but no excessive bleeding.  No pelvic or abdominal pain.  Moving to [place] this summer.  Current Outpatient Medications  Medication Sig Dispense Refill  . Calcium-Vitamin D 600-200 MG-UNIT per tablet Take 1 tablet by mouth daily.     No current facility-administered medications for this visit.   Allergies: Patient has no known allergies.  Patient's last menstrual period was 09/10/2019.  Past medical history,surgical history, problem list, medications, allergies, family history and social history were all reviewed and documented as reviewed in the EPIC chart.  ROS:  Feeling well. No dyspnea or chest pain on exertion.  No abdominal pain, change in bowel habits, black or bloody stools.  No urinary tract symptoms. GYN ROS: irregular menses, no excessive bleeding, pelvic pain or discharge, no breast pain or new or enlarging lumps on self exam. No neurological complaints.   OBJECTIVE:  BP 118/76   Ht 5\' 9"  (1.753 m)   Wt 151 lb (68.5 kg)   LMP 09/10/2019   BMI 22.30 kg/m  The patient appears well, alert, oriented x 3, in no distress. ENT normal.  Neck supple. No cervical or supraclavicular adenopathy or thyromegaly.  Lungs are clear, good air entry, no wheezes, rhonchi or rales. S1 and S2 normal, no murmurs, regular rate and rhythm.  Abdomen soft without tenderness, guarding, mass or organomegaly.  Neurological is normal, no focal findings.  BREAST EXAM: breasts appear normal, no suspicious masses, no skin or nipple changes or axillary nodes  PELVIC EXAM: VULVA: normal appearing vulva with no masses, tenderness or lesions, VAGINA: normal appearing vagina with normal color and discharge, no lesions, CERVIX: normal appearing cervix without discharge or lesions, UTERUS: uterus is normal size,  shape, consistency and nontender, ADNEXA: normal adnexa in size, nontender and no masses  Chaperone: [name] present during the examination  ASSESSMENT:  49 y.o. G2P2 here for annual gynecologic exam  PLAN:   1.  Perimenopausal.  Discussed normal menstrual pattern changes.  She does not describe any bothersome or excessive menstrual bleeding at this time, but if she does develop that she should let us know as there are treatments/management options available.  She prefers just to monitor at this time.  She has been followed for her thyroid condition by her internist. 2. Pap smear 10/2018.  No significant history of abnormal Pap smears.  Next Pap smear due 2023 following the current guidelines recommending the 3 year interval. 3. Contraception. History of bilateral salpingectomy and right oophorectomy. 4. Mammogram 12/2018.  Breast exam normal.  Continue annual mammograms. 5. History of persistent complex left ovarian cyst.  Resolved on 10/2018 pelvic ultrasound and replaced with 2 simple left ovarian follicles.  Asymptomatic at this time, no need for specific ultrasound follow-up unless symptoms were to develop. 6. Health maintenance.  She will proceed to lab today for routine screening blood work (lipids, CBC, CMP).  Return annually or sooner, prn.  Joseph Pierini MD 10/31/19
